# Patient Record
Sex: Male | Born: 1975 | Race: White | Hispanic: No | Marital: Married | Smoking: Never smoker
Health system: Southern US, Community
[De-identification: ages and names within clinical notes are randomized; demographics above are authoritative.]

## PROBLEM LIST (undated history)

## (undated) DIAGNOSIS — J45909 Unspecified asthma, uncomplicated: Secondary | ICD-10-CM

## (undated) HISTORY — PX: INNER EAR SURGERY: SHX679

## (undated) HISTORY — PX: VASECTOMY: SHX75

---

## 2012-12-29 ENCOUNTER — Encounter: Payer: Self-pay | Admitting: Podiatry

## 2012-12-29 ENCOUNTER — Ambulatory Visit (INDEPENDENT_AMBULATORY_CARE_PROVIDER_SITE_OTHER): Payer: BC Managed Care – PPO | Admitting: Podiatry

## 2012-12-29 ENCOUNTER — Ambulatory Visit (INDEPENDENT_AMBULATORY_CARE_PROVIDER_SITE_OTHER): Payer: BC Managed Care – PPO

## 2012-12-29 VITALS — BP 112/63 | HR 78 | Resp 16 | Ht 69.0 in | Wt 207.0 lb

## 2012-12-29 DIAGNOSIS — M79671 Pain in right foot: Secondary | ICD-10-CM

## 2012-12-29 DIAGNOSIS — M79609 Pain in unspecified limb: Secondary | ICD-10-CM

## 2012-12-29 DIAGNOSIS — M722 Plantar fascial fibromatosis: Secondary | ICD-10-CM

## 2012-12-29 MED ORDER — METHYLPREDNISOLONE (PAK) 4 MG PO TABS: ORAL_TABLET | ORAL | Status: DC

## 2012-12-29 MED ORDER — MELOXICAM 15 MG PO TABS: 15.0000 mg | ORAL_TABLET | Freq: Every day | ORAL | Status: DC

## 2012-12-29 NOTE — Patient Instructions (Signed)
Plantar Fasciitis (Heel Spur Syndrome) with Rehab The plantar fascia is a fibrous, ligament-like, soft-tissue structure that spans the bottom of the foot. Plantar fasciitis is a condition that causes pain in the foot due to inflammation of the tissue. SYMPTOMS   Pain and tenderness on the underneath side of the foot.  Pain that worsens with standing or walking. CAUSES  Plantar fasciitis is caused by irritation and injury to the plantar fascia on the underneath side of the foot. Common mechanisms of injury include:  Direct trauma to bottom of the foot.  Damage to a small nerve that runs under the foot where the main fascia attaches to the heel bone.  Stress placed on the plantar fascia due to bone spurs. RISK INCREASES WITH:   Activities that place stress on the plantar fascia (running, jumping, pivoting, or cutting).  Poor strength and flexibility.  Improperly fitted shoes.  Tight calf muscles.  Flat feet.  Failure to warm-up properly before activity.  Obesity. PREVENTION  Warm up and stretch properly before activity.  Allow for adequate recovery between workouts.  Maintain physical fitness:  Strength, flexibility, and endurance.  Cardiovascular fitness.  Maintain a health body weight.  Avoid stress on the plantar fascia.  Wear properly fitted shoes, including arch supports for individuals who have flat feet. PROGNOSIS  If treated properly, then the symptoms of plantar fasciitis usually resolve without surgery. However, occasionally surgery is necessary. RELATED COMPLICATIONS   Recurrent symptoms that may result in a chronic condition.  Problems of the lower back that are caused by compensating for the injury, such as limping.  Pain or weakness of the foot during push-off following surgery.  Chronic inflammation, scarring, and partial or complete fascia tear, occurring more often from repeated injections. TREATMENT  Treatment initially involves the use of  ice and medication to help reduce pain and inflammation. The use of strengthening and stretching exercises may help reduce pain with activity, especially stretches of the Achilles tendon. These exercises may be performed at home or with a therapist. Your caregiver may recommend that you use heel cups of arch supports to help reduce stress on the plantar fascia. Occasionally, corticosteroid injections are given to reduce inflammation. If symptoms persist for greater than 6 months despite non-surgical (conservative), then surgery may be recommended.  MEDICATION   If pain medication is necessary, then nonsteroidal anti-inflammatory medications, such as aspirin and ibuprofen, or other minor pain relievers, such as acetaminophen, are often recommended.  Do not take pain medication within 7 days before surgery.  Prescription pain relievers may be given if deemed necessary by your caregiver. Use only as directed and only as much as you need.  Corticosteroid injections may be given by your caregiver. These injections should be reserved for the most serious cases, because they may only be given a certain number of times. HEAT AND COLD  Cold treatment (icing) relieves pain and reduces inflammation. Cold treatment should be applied for 10 to 15 minutes every 2 to 3 hours for inflammation and pain and immediately after any activity that aggravates your symptoms. Use ice packs or massage the area with a piece of ice (ice massage).  Heat treatment may be used prior to performing the stretching and strengthening activities prescribed by your caregiver, physical therapist, or athletic trainer. Use a heat pack or soak the injury in warm water. SEEK IMMEDIATE MEDICAL CARE IF:  Treatment seems to offer no benefit, or the condition worsens.  Any medications produce adverse side effects. EXERCISES RANGE   OF MOTION (ROM) AND STRETCHING EXERCISES - Plantar Fasciitis (Heel Spur Syndrome) These exercises may help you  when beginning to rehabilitate your injury. Your symptoms may resolve with or without further involvement from your physician, physical therapist or athletic trainer. While completing these exercises, remember:   Restoring tissue flexibility helps normal motion to return to the joints. This allows healthier, less painful movement and activity.  An effective stretch should be held for at least 30 seconds.  A stretch should never be painful. You should only feel a gentle lengthening or release in the stretched tissue. RANGE OF MOTION - Toe Extension, Flexion  Sit with your right / left leg crossed over your opposite knee.  Grasp your toes and gently pull them back toward the top of your foot. You should feel a stretch on the bottom of your toes and/or foot.  Hold this stretch for __________ seconds.  Now, gently pull your toes toward the bottom of your foot. You should feel a stretch on the top of your toes and or foot.  Hold this stretch for __________ seconds. Repeat __________ times. Complete this stretch __________ times per day.  RANGE OF MOTION - Ankle Dorsiflexion, Active Assisted  Remove shoes and sit on a chair that is preferably not on a carpeted surface.  Place right / left foot under knee. Extend your opposite leg for support.  Keeping your heel down, slide your right / left foot back toward the chair until you feel a stretch at your ankle or calf. If you do not feel a stretch, slide your bottom forward to the edge of the chair, while still keeping your heel down.  Hold this stretch for __________ seconds. Repeat __________ times. Complete this stretch __________ times per day.  STRETCH  Gastroc, Standing  Place hands on wall.  Extend right / left leg, keeping the front knee somewhat bent.  Slightly point your toes inward on your back foot.  Keeping your right / left heel on the floor and your knee straight, shift your weight toward the wall, not allowing your back to  arch.  You should feel a gentle stretch in the right / left calf. Hold this position for __________ seconds. Repeat __________ times. Complete this stretch __________ times per day. STRETCH  Soleus, Standing  Place hands on wall.  Extend right / left leg, keeping the other knee somewhat bent.  Slightly point your toes inward on your back foot.  Keep your right / left heel on the floor, bend your back knee, and slightly shift your weight over the back leg so that you feel a gentle stretch deep in your back calf.  Hold this position for __________ seconds. Repeat __________ times. Complete this stretch __________ times per day. STRETCH  Gastrocsoleus, Standing  Note: This exercise can place a lot of stress on your foot and ankle. Please complete this exercise only if specifically instructed by your caregiver.   Place the ball of your right / left foot on a step, keeping your other foot firmly on the same step.  Hold on to the wall or a rail for balance.  Slowly lift your other foot, allowing your body weight to press your heel down over the edge of the step.  You should feel a stretch in your right / left calf.  Hold this position for __________ seconds.  Repeat this exercise with a slight bend in your right / left knee. Repeat __________ times. Complete this stretch __________ times per day.    STRENGTHENING EXERCISES - Plantar Fasciitis (Heel Spur Syndrome)  These exercises may help you when beginning to rehabilitate your injury. They may resolve your symptoms with or without further involvement from your physician, physical therapist or athletic trainer. While completing these exercises, remember:   Muscles can gain both the endurance and the strength needed for everyday activities through controlled exercises.  Complete these exercises as instructed by your physician, physical therapist or athletic trainer. Progress the resistance and repetitions only as guided. STRENGTH - Towel  Curls  Sit in a chair positioned on a non-carpeted surface.  Place your foot on a towel, keeping your heel on the floor.  Pull the towel toward your heel by only curling your toes. Keep your heel on the floor.  If instructed by your physician, physical therapist or athletic trainer, add ____________________ at the end of the towel. Repeat __________ times. Complete this exercise __________ times per day. STRENGTH - Ankle Inversion  Secure one end of a rubber exercise band/tubing to a fixed object (table, pole). Loop the other end around your foot just before your toes.  Place your fists between your knees. This will focus your strengthening at your ankle.  Slowly, pull your big toe up and in, making sure the band/tubing is positioned to resist the entire motion.  Hold this position for __________ seconds.  Have your muscles resist the band/tubing as it slowly pulls your foot back to the starting position. Repeat __________ times. Complete this exercises __________ times per day.  Document Released: 02/05/2005 Document Revised: 04/30/2011 Document Reviewed: 05/20/2008 [employer] Patient Information 2014 [employer]. Plantar Fasciitis Plantar fasciitis is a common condition that causes foot pain. It is soreness (inflammation) of the band of tough fibrous tissue on the bottom of the foot that runs from the heel bone (calcaneus) to the ball of the foot. The cause of this soreness may be from excessive standing, poor fitting shoes, running on hard surfaces, being overweight, having an abnormal walk, or overuse (this is common in runners) of the painful foot or feet. It is also common in aerobic exercise dancers and ballet dancers. SYMPTOMS  Most people with plantar fasciitis complain of:  Severe pain in the morning on the bottom of their foot especially when taking the first steps out of bed. This pain recedes after a few minutes of walking.  Severe pain is experienced also during walking  following a long period of inactivity.  Pain is worse when walking barefoot or up stairs DIAGNOSIS   Your caregiver will diagnose this condition by examining and feeling your foot.  Special tests such as X-rays of your foot, are usually not needed. PREVENTION   Consult a sports medicine professional before beginning a new exercise program.  Walking programs offer a good workout. With walking there is a lower chance of overuse injuries common to runners. There is less impact and less jarring of the joints.  Begin all new exercise programs slowly. If problems or pain develop, decrease the amount of time or distance until you are at a comfortable level.  Wear good shoes and replace them regularly.  Stretch your foot and the heel cords at the back of the ankle (Achilles tendon) both before and after exercise.  Run or exercise on even surfaces that are not hard. For example, asphalt is better than pavement.  Do not run barefoot on hard surfaces.  If using a treadmill, vary the incline.  Do not continue to workout if you have foot or joint   problems. Seek professional help if they do not improve. HOME CARE INSTRUCTIONS   Avoid activities that cause you pain until you recover.  Use ice or cold packs on the problem or painful areas after working out.  Only take over-the-counter or prescription medicines for pain, discomfort, or fever as directed by your caregiver.  Soft shoe inserts or athletic shoes with air or gel sole cushions may be helpful.  If problems continue or become more severe, consult a sports medicine caregiver or your own health care provider. Cortisone is a potent anti-inflammatory medication that may be injected into the painful area. You can discuss this treatment with your caregiver. MAKE SURE YOU:   Understand these instructions.  Will watch your condition.  Will get help right away if you are not doing well or get worse. Document Released: 10/31/2000 Document  Revised: 04/30/2011 Document Reviewed: 12/31/2007 [employer] Patient Information 2014 [employer].  

## 2012-12-29 NOTE — Progress Notes (Signed)
N HURT L B/L HEEL AND WHOLE BOTTOM OF FEET B/L D 3 M O SUDDEN C WORSE A STANDING, WALKING T OTC INSERTS

## 2012-12-29 NOTE — Progress Notes (Signed)
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 He states that his feet are severely painful bilateral. This been going on for about 3 months and come on all of a sudden seems to be getting worse particularly standing and walking he tried over-the-counter inserts no avail.  Objective: Vital signs are stable he is alert and oriented x3. I reviewed his past medical history medications and allergies. Review of systems unremarkable. Bilateral lower extremity exam: Vascular evaluation demonstrates strong palpable pulses bilateral. Neurologic sensorium is intact per since once the monofilament. Deep tendon reflexes are intact bilateral. Muscle strength is 5 over 5 dorsiflexors plantar flexors inverters and evertors. Cutaneous evaluation demonstrates supple while hydrated cutis. Orthopedic evaluation demonstrates pain on palpation to the medial calcaneal tubercles bilateral. Radiographic evaluation demonstrates soft tissue increase in density at the plantar fascial calcaneal insertion sites.  Assessment: Plantar fasciitis bilateral.  Plan: We discussed the etiology pathology conservative versus surgical therapies at this point in time we are going to start him on a Sterapred Dosepak to be followed by Mobic. He was injected to the bilateral heels today and plantar fascial strapping sore applied. He was dispensed a night splint. We discussed appropriate shoe gear stretching exercises and ice therapy and he was dispensed oral and written home-going instructions for the care of his feet and exercises. Followup with him in one month.

## 2013-01-28 ENCOUNTER — Encounter: Payer: Self-pay | Admitting: Podiatry

## 2013-01-28 ENCOUNTER — Ambulatory Visit (INDEPENDENT_AMBULATORY_CARE_PROVIDER_SITE_OTHER): Payer: BC Managed Care – PPO | Admitting: Podiatry

## 2013-01-28 VITALS — BP 157/83 | HR 90 | Resp 16 | Ht 70.0 in | Wt 205.0 lb

## 2013-01-28 DIAGNOSIS — M722 Plantar fascial fibromatosis: Secondary | ICD-10-CM

## 2013-01-28 MED ORDER — DICLOFENAC SODIUM 75 MG PO TBEC: 75.0000 mg | DELAYED_RELEASE_TABLET | Freq: Two times a day (BID) | ORAL | Status: DC

## 2013-01-28 NOTE — Progress Notes (Signed)
Vincent Melendez presents today for followup of his plantar fasciitis is at the right doing great the left is still hurting. He continues to take his meloxicam however causes flushing to his face.  Objective: Vital signs are stable he is alert and oriented x3. Pulses are palpable. He has pain on palpation medial continued tubercle of the left heel. Non-the right.  Assessment: Plantar fasciitis left foot.  Plan: Injected the left foot again today continue the use of the night splint changed his meloxicam to diclofenac 75 mg 1 by mouth twice a day continue ice therapy shoe gear modification stretching exercises and I will followup with him in one month. Orthotics may be necessary.

## 2013-03-04 ENCOUNTER — Encounter: Payer: Self-pay | Admitting: Podiatry

## 2013-03-04 ENCOUNTER — Ambulatory Visit (INDEPENDENT_AMBULATORY_CARE_PROVIDER_SITE_OTHER): Payer: BC Managed Care – PPO | Admitting: Podiatry

## 2013-03-04 VITALS — BP 111/71 | HR 74 | Resp 16 | Ht 69.0 in | Wt 198.0 lb

## 2013-03-04 DIAGNOSIS — M722 Plantar fascial fibromatosis: Secondary | ICD-10-CM

## 2013-03-04 NOTE — Progress Notes (Signed)
He presents today for followup of his plantar fasciitis to his left heel. He states that this point it's about 85% better. When I asked him about his medication he states that he takes that when he can remember to. He continues to wear his night splint on her regular basis. He denies icing.  Objective: Vital signs are stable he is alert and oriented x3. Pulses are palpable left lower extremity. Pain on palpation medial continued tubercle of the left heel is much better than previously noted.  Assessment: Plantar fasciitis residual in nature left foot.  Plan: Discussed etiology pathology conservative versus surgical therapies. At this point I reinjected him today with his third dose of Kenalog left foot. He will start icing. He will continue his medication on a regular basis. And continue the use of his night splint. I will followup with him in one month at which time if he is not close to 100% then we will go ahead and get him a pair of orthotics made.

## 2013-04-01 ENCOUNTER — Ambulatory Visit: Payer: BC Managed Care – PPO | Admitting: Podiatry

## 2013-08-31 ENCOUNTER — Ambulatory Visit: Payer: Self-pay | Admitting: Gastroenterology

## 2013-12-06 ENCOUNTER — Ambulatory Visit: Payer: Self-pay | Admitting: Physician Assistant

## 2013-12-16 ENCOUNTER — Ambulatory Visit: Payer: Self-pay

## 2014-05-09 ENCOUNTER — Ambulatory Visit: Payer: Self-pay | Admitting: Registered Nurse

## 2014-09-23 ENCOUNTER — Encounter: Payer: Self-pay | Admitting: Family Medicine

## 2014-09-23 ENCOUNTER — Ambulatory Visit (INDEPENDENT_AMBULATORY_CARE_PROVIDER_SITE_OTHER): Payer: BLUE CROSS/BLUE SHIELD | Admitting: Family Medicine

## 2014-09-23 ENCOUNTER — Ambulatory Visit: Payer: Self-pay | Admitting: Physician Assistant

## 2014-09-23 VITALS — BP 108/60 | HR 80 | Temp 97.8°F | Resp 16 | Wt 207.0 lb

## 2014-09-23 DIAGNOSIS — I499 Cardiac arrhythmia, unspecified: Secondary | ICD-10-CM | POA: Insufficient documentation

## 2014-09-23 DIAGNOSIS — L719 Rosacea, unspecified: Secondary | ICD-10-CM

## 2014-09-23 DIAGNOSIS — L309 Dermatitis, unspecified: Secondary | ICD-10-CM | POA: Insufficient documentation

## 2014-09-23 DIAGNOSIS — J4540 Moderate persistent asthma, uncomplicated: Secondary | ICD-10-CM | POA: Diagnosis not present

## 2014-09-23 DIAGNOSIS — F419 Anxiety disorder, unspecified: Secondary | ICD-10-CM | POA: Insufficient documentation

## 2014-09-23 DIAGNOSIS — J309 Allergic rhinitis, unspecified: Secondary | ICD-10-CM | POA: Insufficient documentation

## 2014-09-23 DIAGNOSIS — F430 Acute stress reaction: Secondary | ICD-10-CM | POA: Insufficient documentation

## 2014-09-23 DIAGNOSIS — J45909 Unspecified asthma, uncomplicated: Secondary | ICD-10-CM | POA: Insufficient documentation

## 2014-09-23 DIAGNOSIS — K219 Gastro-esophageal reflux disease without esophagitis: Secondary | ICD-10-CM | POA: Insufficient documentation

## 2014-09-23 MED ORDER — MOMETASONE FUROATE 0.1 % EX CREA: 1.0000 "application " | TOPICAL_CREAM | Freq: Every day | CUTANEOUS | Status: DC

## 2014-09-23 MED ORDER — BRIMONIDINE TARTRATE 0.33 % EX GEL: CUTANEOUS | Status: DC

## 2014-09-23 MED ORDER — MONTELUKAST SODIUM 10 MG PO TABS: 10.0000 mg | ORAL_TABLET | Freq: Every day | ORAL | Status: DC

## 2014-09-23 MED ORDER — DOXYCYCLINE HYCLATE 100 MG PO TABS: 100.0000 mg | ORAL_TABLET | Freq: Two times a day (BID) | ORAL | Status: DC

## 2014-09-23 NOTE — Progress Notes (Signed)
Patient ID: Vincent Melendez, male   DOB: 1975-06-12, 39 y.o.   MRN: [id number]    Subjective:  HPI Pt is here for a rash. It has been there for about 4 days. It is located on his chest on the left side and he has one bump on his right cheek. It appears to be raised above the skin, almost pimple like lesion. He reports they do itch a little and they are sometimes a little "achy". He reports that he googled it and google said it was eczema.  Prior to Admission medications   Medication Sig Start Date End Date Taking? Authorizing Provider  albuterol (VENTOLIN HFA) 108 (90 BASE) MCG/ACT inhaler Inhale into the lungs. 02/23/14  Yes Historical Provider, MD  Aspirin-Acetaminophen-Caffeine (EXCEDRIN MIGRAINE PO) Take by mouth as needed.   Yes Historical Provider, MD  diphenhydrAMINE (BENADRYL) 25 MG tablet Take by mouth.   Yes Historical Provider, MD  Fluticasone-Salmeterol (ADVAIR DISKUS) 250-50 MCG/DOSE AEPB Inhale into the lungs. 10/08/13  Yes Historical Provider, MD  loratadine (CLARITIN REDITABS) 10 MG dissolvable tablet Take by mouth.   Yes Historical Provider, MD  montelukast (SINGULAIR) 10 MG tablet Take by mouth. 12/16/13  Yes Historical Provider, MD  fluticasone (FLONASE ALLERGY RELIEF) 50 MCG/ACT nasal spray Place into the nose.    Historical Provider, MD    Patient Active Problem List   Diagnosis Date Noted  . Acute stress disorder 09/23/2014  . Allergic rhinitis 09/23/2014  . Anxiety 09/23/2014  . Airway hyperreactivity 09/23/2014  . Acid reflux 09/23/2014  . Irregular cardiac rhythm 09/23/2014  . RAD (reactive airway disease) 09/23/2014    History reviewed. No pertinent past medical history.  History   Social History  . Marital Status: Married    Spouse Name: N/A  . Number of Children: 2  . Years of Education: N/A   Occupational History  . Self-employed     Full Time; Sell [employer]   Social History Main Topics  . Smoking status: Never Smoker   . Smokeless  tobacco: Never Used  . Alcohol Use: Yes     Comment: occasionally  . Drug Use: No  . Sexual Activity: Not on file   Other Topics Concern  . Not on file   Social History Narrative    Allergies  Allergen Reactions  . Codeine Itching    Review of Systems  Constitutional: Negative.   HENT: Negative.   Eyes: Negative.   Respiratory: Negative.   Cardiovascular: Negative.   Gastrointestinal: Negative.   Genitourinary: Negative.   Musculoskeletal: Negative.   Skin: Positive for itching and rash.  Neurological: Negative.   Endo/Heme/Allergies: Negative.   Psychiatric/Behavioral: Negative.      Objective:  BP 108/60 mmHg  Pulse 80  Temp(Src) 97.8 F (36.6 C) (Oral)  Resp 16  Wt 207 lb (93.895 kg)  Physical Exam  Constitutional: He is well-developed, well-nourished, and in no distress.  Skin: Rash noted. There is erythema.  Does have red rash on his nose and face. Does have deeper papules palpable under right check. Some scaly lesions on right check. Also has rash on right chest. Erythematous papules.        Assessment and Plan :  1. Rosacea Condition is worsening. Will start medication for better control.  Patient instructed to call back if condition worsens or does not improve.    - mometasone (ELOCON) 0.1 % cream; Apply 1 application topically daily.  Dispense: 45 g; Refill: 0 - doxycycline (VIBRA-TABS) 100 MG  tablet; Take 1 tablet (100 mg total) by mouth 2 (two) times daily.  Dispense: 60 tablet; Refill: 0 - Brimonidine Tartrate (MIRVASO) 0.33 % GEL; Apply daily  Dispense: 30 g; Refill: 1  2. Eczema Worsening. Will start medication  - mometasone (ELOCON) 0.1 % cream; Apply 1 application topically daily.  Dispense: 45 g; Refill: 2  3. Airway hyperreactivity, moderate persistent, uncomplicated Stable. Refill medication today.   - montelukast (SINGULAIR) 10 MG tablet; Take 1 tablet (10 mg total) by mouth at bedtime.  Dispense: 30 tablet; Refill: 1   Lorie Phenix, MD  [facility] 09/23/2014 3:41 PM

## 2014-09-27 ENCOUNTER — Telehealth: Payer: Self-pay

## 2014-09-27 NOTE — Telephone Encounter (Signed)
Pt advised as directed below.  He reports the rash seems to be clearing up.   Thanks,   -Vernona Rieger

## 2014-09-27 NOTE — Telephone Encounter (Signed)
-----   Message from Lorie Phenix, MD sent at 09/26/2014  1:57 PM EDT ----- Please call patient and see if rash improving. Forgot to tell him to use steroid cream sparingly.  Please tell him to do this.  And we can refer to Derm if not improving. Thanks.

## 2014-10-12 ENCOUNTER — Other Ambulatory Visit: Payer: Self-pay | Admitting: Physician Assistant

## 2014-10-12 DIAGNOSIS — J45909 Unspecified asthma, uncomplicated: Secondary | ICD-10-CM

## 2014-10-12 MED ORDER — FLUTICASONE-SALMETEROL 250-50 MCG/DOSE IN AEPB: 1.0000 | INHALATION_SPRAY | Freq: Two times a day (BID) | RESPIRATORY_TRACT | Status: DC

## 2014-10-12 NOTE — Telephone Encounter (Signed)
Pt contacted office for refill request on the following medications:  Fluticasone-Salmeterol (ADVAIR DISKUS) 250-50 MCG/DOSE AEPB.  [facility]  [place].  EA#[phone]   Pt states he is completely out and is requesting this sent to the pharmacy today if possible/MW

## 2014-10-12 NOTE — Telephone Encounter (Signed)
Megan with [facility] called to request a refill on Fluticasone-Salmeterol (ADVAIR DISKUS) 250-50 MCG/DOSE AEPB for pt because the fax she was trying to send was rejected. This medication was last written by Sharen Hint. Thanks TNP

## 2014-10-13 ENCOUNTER — Other Ambulatory Visit: Payer: Self-pay

## 2014-10-13 DIAGNOSIS — J45909 Unspecified asthma, uncomplicated: Secondary | ICD-10-CM

## 2014-10-13 DIAGNOSIS — J4540 Moderate persistent asthma, uncomplicated: Secondary | ICD-10-CM

## 2014-10-13 MED ORDER — FLUTICASONE-SALMETEROL 250-50 MCG/DOSE IN AEPB: 1.0000 | INHALATION_SPRAY | Freq: Two times a day (BID) | RESPIRATORY_TRACT | Status: DC

## 2015-01-19 ENCOUNTER — Other Ambulatory Visit: Payer: Self-pay | Admitting: Family Medicine

## 2015-01-19 DIAGNOSIS — J309 Allergic rhinitis, unspecified: Secondary | ICD-10-CM

## 2015-01-24 ENCOUNTER — Telehealth: Payer: Self-pay | Admitting: Physician Assistant

## 2015-01-24 NOTE — Telephone Encounter (Signed)
Pt called saying when he went to pick up the Fluticasone-Salmeterol (ADVAIR DISKUS) 250-50 MCG/DOSE AEPB The price was 290.00.  He want to know if there is something else he can use.Marland Kitchen.   He uses [facility] in [place]  Please call back at [phone]  Thanks Barth Kirkseri

## 2015-01-24 NOTE — Telephone Encounter (Signed)
All the steroid inhalers are very expensive.

## 2015-01-24 NOTE — Telephone Encounter (Signed)
All of the steroid inhalers are very expensive.  A little confused as this rx was written months ago.  Why is just he filling it now?  Is he having acute symptoms. This is a maintenance medication. Thanks.

## 2015-01-25 NOTE — Telephone Encounter (Signed)
Pt reports he takes Advair everyday; his insurance max out until the end of the year.  He would like samples to get him through the end of the year.   Thanks,   -Vernona RiegerLaura

## 2015-01-25 NOTE — Telephone Encounter (Signed)
Ok to leave samples. Thanks.   

## 2015-01-25 NOTE — Telephone Encounter (Signed)
LMTCB 01/25/2015  Thanks,   -Doreen Garretson  

## 2015-03-05 ENCOUNTER — Ambulatory Visit
Admission: EM | Admit: 2015-03-05 | Discharge: 2015-03-05 | Disposition: A | Discharge status: Home / Self Care | Payer: BLUE CROSS/BLUE SHIELD | Attending: Family Medicine | Admitting: Family Medicine

## 2015-03-05 DIAGNOSIS — H6123 Impacted cerumen, bilateral: Secondary | ICD-10-CM

## 2015-03-05 DIAGNOSIS — J069 Acute upper respiratory infection, unspecified: Secondary | ICD-10-CM

## 2015-03-05 DIAGNOSIS — H6691 Otitis media, unspecified, right ear: Secondary | ICD-10-CM

## 2015-03-05 MED ORDER — AMOXICILLIN-POT CLAVULANATE 875-125 MG PO TABS
1.0000 | ORAL_TABLET | Freq: Two times a day (BID) | ORAL | Status: DC
Start: 2015-03-05 — End: 2015-04-04

## 2015-03-05 NOTE — ED Provider Notes (Signed)
[facility]  ____________________________________________  Time seen: Approximately 9:40 AM  I have reviewed the triage vital signs and the nursing notes.   HISTORY  Chief Complaint Facial Pain and Otalgia   HPI Vincent Melendez is a 40 y.o. male presents with a complaint of one week of runny nose, nasal congestion with bilateral ear discomfort. Patient reports that nasal congestion has improved however bilateral ears feel clogged and stopped up. States that the right feels worse in the left. Reports current right ear discomfort is 4 out of 10 aching pain. No pain radiation. Patient reports that he is taking over-the-counter DayQuil which has helped with symptoms. Denies acute hearing deficits. Denies ear discharge or drainage. Denies head injury or trauma. Reports continues to eat and drink well. Denies fevers. Denies neck or back pain.   History reviewed. No pertinent past medical history.  Patient Active Problem List   Diagnosis Date Noted  . Acute stress disorder 09/23/2014  . Allergic rhinitis 09/23/2014  . Anxiety 09/23/2014  . Airway hyperreactivity 09/23/2014  . Acid reflux 09/23/2014  . Irregular cardiac rhythm 09/23/2014  . RAD (reactive airway disease) 09/23/2014  . Rosacea 09/23/2014  . Eczema 09/23/2014    Past Surgical History  Procedure Laterality Date  . Inner ear surgery Right   . Vasectomy      Current Outpatient Rx  Name  Route  Sig  Dispense  Refill             . Fluticasone-Salmeterol (ADVAIR DISKUS) 250-50 MCG/DOSE AEPB   Inhalation   Inhale 1 puff into the lungs every 12 (twelve) hours.   1 each   5   . loratadine (CLARITIN REDITABS) 10 MG dissolvable tablet   Oral   Take by mouth.         . montelukast (SINGULAIR) 10 MG tablet      TAKE 1 TABLET(10 MG) BY MOUTH AT BEDTIME   30 tablet   5   . albuterol (VENTOLIN HFA) 108 (90 BASE) MCG/ACT inhaler   Inhalation   Inhale into the lungs.         .           .            .           . fluticasone (FLONASE ALLERGY RELIEF) 50 MCG/ACT nasal spray   Nasal   Place into the nose.         . mometasone (ELOCON) 0.1 % cream   Topical   Apply 1 application topically daily.   45 g   0   . mometasone (ELOCON) 0.1 % cream   Topical   Apply 1 application topically daily.   45 g   2     Allergies Codeine  Family History  Problem Relation Age of Onset  . Fibromyalgia Mother   . COPD Father   . Kidney disease Father   . Healthy Sister   . Healthy Sister   . Arthritis Sister   . Healthy Sister   . Bipolar disorder Sister   . Healthy Sister   . Asthma Brother   . Anxiety disorder Brother   . Healthy Brother   . Healthy Brother   . Healthy Brother     Social History Social History  Substance Use Topics  . Smoking status: Never Smoker   . Smokeless tobacco: Never Used  . Alcohol Use: Yes     Comment: occasionally    Review of  Systems Constitutional: No fever/chills Eyes: No visual changes. ENT: No sore throat. Positive runny nose, nasal congestion and bilateral ear discomfort. Cardiovascular: Denies chest pain. Respiratory: Denies shortness of breath. Gastrointestinal: No abdominal pain.  No nausea, no vomiting.  No diarrhea.  No constipation. Genitourinary: Negative for dysuria. Musculoskeletal: Negative for back pain. Skin: Negative for rash. Neurological: Negative for headaches, focal weakness or numbness.  10-point ROS otherwise negative.  ____________________________________________   PHYSICAL EXAM:  VITAL SIGNS: ED Triage Vitals  Enc Vitals Group     BP 03/05/15 0845 105/58 mmHg     Pulse Rate 03/05/15 0845 80     Resp 03/05/15 0845 16     Temp 03/05/15 0845 96.2 F (35.7 C)     Temp Source 03/05/15 0845 Tympanic     SpO2 03/05/15 0845 99 %     Weight 03/05/15 0845 200 lb (90.719 kg)     Height 03/05/15 0845 5\' 9"  (1.753 m)     Head Cir --      Peak Flow --      Pain Score 03/05/15 0850 6     Pain Loc --       Pain Edu? --      Excl. in GC? --     Constitutional: Alert and oriented. Well appearing and in no acute distress. Eyes: Conjunctivae are normal. PERRL. EOMI. Head: Atraumatic. No sinus tenderness to palpation. No swelling. No erythema.  Ears: No surrounding erythema or swelling or tenderness bilaterally. No tenderness with auricle stimulation. Bilateral canals with complete cerumen impaction. No exudate or drainage.  Post irrigations ears reexamined. Bilateral canals clear. Left ear no erythema, normal TM. Right ear: Moderate erythema and dullness. Bilateral TMs appear intact.  Nose: Nasal congestion bilateral nasal turbinate erythema. Nares patent.  Mouth/Throat: Mucous membranes are moist.  Oropharynx non-erythematous. No tonsillar swelling or exudate. Neck: No stridor.  No cervical spine tenderness to palpation. Hematological/Lymphatic/Immunilogical: No cervical lymphadenopathy. Cardiovascular: Normal rate, regular rhythm. Grossly normal heart sounds.  Good peripheral circulation. Respiratory: Normal respiratory effort.  No retractions. Lungs CTAB. No wheezes, rales or rhonchi. Good air movement. Gastrointestinal: Soft and nontender.  Musculoskeletal: No lower or upper extremity tenderness nor edema.  No cervical, thoracic or lumbar tenderness. Neurologic:  Normal speech and language. No gross focal neurologic deficits are appreciated. No gait instability. Skin:  Skin is warm, dry and intact. No rash noted. Psychiatric: Mood and affect are normal. Speech and behavior are normal.  ____________________________________________   LABS (all labs ordered are listed, but only abnormal results are displayed)  Labs Reviewed - No data to display ____________________________________________   PROCEDURES  Procedure(s) performed:  Bilateral ears irrigated by RN.  INITIAL IMPRESSION / ASSESSMENT AND PLAN / ED COURSE  Pertinent labs & imaging results that were available during my care of  the patient were reviewed by me and considered in my medical decision making (see chart for details).  Very well-appearing patient. No acute distress. Presents with complaints of 1 week of runny nose, nasal congestion and sinus drainage. Reports that congestion and sinus congestion has improved however reports he is here today because of both ears feeling clogged and stopped up. Denies fevers. Denies headache or neck pain. Ears and surrounding areas nontender. Very well-appearing patient. Will irrigate bilateral ears and reevaluate.  Post irrigation patient reports bilateral ears feeling better. Ears reexamined. Patient with a right otitis media. As patient also with some recent sinus inflammation and nasal congestion and right otitis media will treat with  oral Augmentin. When necessary over-the-counter decongestants. Encouraged patient to follow-up closely with primary care physician or ENT. Patient reports he has a history of follow with Dr. Willeen Cass ENT.  Discussed follow up with Primary care physician this week. Discussed follow up and return parameters including no resolution or any worsening concerns. Patient verbalized understanding and agreed to plan.   ____________________________________________   FINAL CLINICAL IMPRESSION(S) / ED DIAGNOSES  Final diagnoses:  Acute right otitis media, recurrence not specified, unspecified otitis media type  Cerumen impaction, bilateral  Upper respiratory infection       Renford Dills, NP 03/05/15 1191  Renford Dills, NP 03/05/15 1042

## 2015-03-05 NOTE — Discharge Instructions (Signed)
Take medication as prescribed. Rest. Drink plenty of fluids.   Follow up with your primary care physician or ENT this week as needed. Return to Urgent care as needed for new or worsening  Concerns.   Otitis Media, Adult Otitis media is redness, soreness, and inflammation of the middle ear. Otitis media may be caused by allergies or, most commonly, by infection. Often it occurs as a complication of the common cold. SIGNS AND SYMPTOMS Symptoms of otitis media may include:  Earache.  Fever.  Ringing in your ear.  Headache.  Leakage of fluid from the ear. DIAGNOSIS To diagnose otitis media, your health care provider will examine your ear with an otoscope. This is an instrument that allows your health care provider to see into your ear in order to examine your eardrum. Your health care provider also will ask you questions about your symptoms. TREATMENT  Typically, otitis media resolves on its own within 3-5 days. Your health care provider may prescribe medicine to ease your symptoms of pain. If otitis media does not resolve within 5 days or is recurrent, your health care provider may prescribe antibiotic medicines if he or she suspects that a bacterial infection is the cause. HOME CARE INSTRUCTIONS   If you were prescribed an antibiotic medicine, finish it all even if you start to feel better.  Take medicines only as directed by your health care provider.  Keep all follow-up visits as directed by your health care provider. SEEK MEDICAL CARE IF:  You have otitis media only in one ear, or bleeding from your nose, or both.  You notice a lump on your neck.  You are not getting better in 3-5 days.  You feel worse instead of better. SEEK IMMEDIATE MEDICAL CARE IF:   You have pain that is not controlled with medicine.  You have swelling, redness, or pain around your ear or stiffness in your neck.  You notice that part of your face is paralyzed.  You notice that the bone behind your  ear (mastoid) is tender when you touch it. MAKE SURE YOU:   Understand these instructions.  Will watch your condition.  Will get help right away if you are not doing well or get worse.   This information is not intended to replace advice given to you by your health care provider. Make sure you discuss any questions you have with your health care provider.   Document Released: 11/11/2003 Document Revised: 02/26/2014 Document Reviewed: 09/02/2012 Elsevier Interactive Patient Education 2016 Else[employer] Inc[employer]  Cerumen Impaction The structures of the external ear canal secrete a waxy substance known as cerumen. Excess cerumen can build up in the ear canal, causing a condition known as cerumen impaction. Cerumen impaction can cause ear pain and disrupt the function of the ear. The rate of cerumen production differs for each individual. In certain individuals, the configuration of the ear canal may decrease his or her ability to naturally remove cerumen. CAUSES Cerumen impaction is caused by excessive cerumen production or buildup. RISK FACTORS  Frequent use of swabs to clean ears.  Having narrow ear canals.  Having eczema.  Being dehydrated. SIGNS AND SYMPTOMS  Diminished hearing.  Ear drainage.  Ear pain.  Ear itch. TREATMENT Treatment may involve:  Over-the-counter or prescription ear drops to soften the cerumen.  Removal of cerumen by a health care provider. This may be done with:  Irrigation with warm water. This is the most common method of removal.  Ear curettes and other instruments.  Surgery. This may be done in severe cases. HOME CARE INSTRUCTIONS  Take medicines only as directed by your health care provider.  Do not insert objects into the ear with the intent of cleaning the ear. PREVENTION  Do not insert objects into the ear, even with the intent of cleaning the ear. Removing cerumen as a part of normal hygiene is not necessary, and the use of swabs in the  ear canal is not recommended.  Drink enough water to keep your urine clear or pale yellow.  Control your eczema if you have it. SEEK MEDICAL CARE IF:  You develop ear pain.  You develop bleeding from the ear.  The cerumen does not clear after you use ear drops as directed.   This information is not intended to replace advice given to you by your health care provider. Make sure you discuss any questions you have with your health care provider.   Document Released: 03/15/2004 Document Revised: 02/26/2014 Document Reviewed: 09/22/2014 Elsevier Interactive Patient Education [employer] Inc[employer]

## 2015-03-05 NOTE — ED Notes (Signed)
Started last week with sinusitis. Over the past few days describes bilatateral ears as having pressure/stopped up, right worse than left

## 2015-04-04 ENCOUNTER — Ambulatory Visit (INDEPENDENT_AMBULATORY_CARE_PROVIDER_SITE_OTHER): Payer: BLUE CROSS/BLUE SHIELD | Admitting: Physician Assistant

## 2015-04-04 ENCOUNTER — Encounter: Payer: Self-pay | Admitting: Physician Assistant

## 2015-04-04 VITALS — BP 118/80 | HR 83 | Temp 98.2°F | Resp 16 | Wt 210.4 lb

## 2015-04-04 DIAGNOSIS — J0140 Acute pansinusitis, unspecified: Secondary | ICD-10-CM

## 2015-04-04 DIAGNOSIS — J4541 Moderate persistent asthma with (acute) exacerbation: Secondary | ICD-10-CM | POA: Diagnosis not present

## 2015-04-04 MED ORDER — DOXYCYCLINE HYCLATE 100 MG PO TABS: 100.0000 mg | ORAL_TABLET | Freq: Two times a day (BID) | ORAL | Status: DC

## 2015-04-04 NOTE — Progress Notes (Signed)
Patient: Vincent Melendez Male    DOB: 07-05-1975   40 y.o.   MRN: [id number] Visit Date: 04/04/2015  Today's Provider: Margaretann Loveless, PA-C   Chief Complaint  Patient presents with  . Cough   Subjective:    Cough This is a new problem. The current episode started in the past 7 days (On Friday). The problem has been gradually worsening. The cough is non-productive. Associated symptoms include a fever (on the weekend), nasal congestion, postnasal drip, rhinorrhea and shortness of breath. Pertinent negatives include no chest pain, chills, ear congestion, ear pain, headaches, sore throat or wheezing. The symptoms are aggravated by cold air. He has tried OTC cough suppressant for the symptoms. The treatment provided no relief.      Allergies  Allergen Reactions  . Codeine Itching   Previous Medications   ALBUTEROL (VENTOLIN HFA) 108 (90 BASE) MCG/ACT INHALER    Inhale into the lungs.   AMOXICILLIN-CLAVULANATE (AUGMENTIN) 875-125 MG TABLET    Take 1 tablet by mouth every 12 (twelve) hours.   ASPIRIN-ACETAMINOPHEN-CAFFEINE (EXCEDRIN MIGRAINE PO)    Take by mouth as needed. Reported on 04/04/2015   BRIMONIDINE TARTRATE (MIRVASO) 0.33 % GEL    Apply daily   DIPHENHYDRAMINE (BENADRYL) 25 MG TABLET    Take by mouth. Reported on 04/04/2015   FLUTICASONE (FLONASE ALLERGY RELIEF) 50 MCG/ACT NASAL SPRAY    Place into the nose. Reported on 04/04/2015   FLUTICASONE-SALMETEROL (ADVAIR DISKUS) 250-50 MCG/DOSE AEPB    Inhale 1 puff into the lungs every 12 (twelve) hours.   LORATADINE (CLARITIN REDITABS) 10 MG DISSOLVABLE TABLET    Take by mouth.   MOMETASONE (ELOCON) 0.1 % CREAM    Apply 1 application topically daily.   MONTELUKAST (SINGULAIR) 10 MG TABLET    TAKE 1 TABLET(10 MG) BY MOUTH AT BEDTIME    Review of Systems  Constitutional: Positive for fever (on the weekend). Negative for chills and fatigue.  HENT: Positive for congestion, postnasal drip, rhinorrhea, sneezing and voice  change. Negative for ear discharge, ear pain and sore throat.   Respiratory: Positive for cough, chest tightness and shortness of breath. Negative for wheezing.   Cardiovascular: Negative for chest pain, palpitations and leg swelling.  Gastrointestinal: Negative for nausea, vomiting and abdominal pain.  Neurological: Negative for dizziness and headaches.    Social History  Substance Use Topics  . Smoking status: Never Smoker   . Smokeless tobacco: Never Used  . Alcohol Use: Yes     Comment: occasionally   Objective:   BP 118/80 mmHg  Pulse 83  Temp(Src) 98.2 F (36.8 C) (Oral)  Resp 16  Wt 210 lb 6.4 oz (95.437 kg)  SpO2 97%  Physical Exam  Constitutional: He appears well-developed and well-nourished. No distress.  HENT:  Head: Normocephalic and atraumatic.  Right Ear: Hearing, external ear and ear canal normal. Tympanic membrane is not erythematous and not bulging. A middle ear effusion is present.  Left Ear: Hearing, external ear and ear canal normal. Tympanic membrane is not erythematous and not bulging. A middle ear effusion is present.  Nose: Mucosal edema and rhinorrhea present. Right sinus exhibits maxillary sinus tenderness and frontal sinus tenderness. Left sinus exhibits maxillary sinus tenderness and frontal sinus tenderness.  Mouth/Throat: Uvula is midline, oropharynx is clear and moist and mucous membranes are normal. No oropharyngeal exudate, posterior oropharyngeal edema or posterior oropharyngeal erythema.  Eyes: Conjunctivae and EOM are normal. Pupils are equal, round, and reactive to  light. Right eye exhibits no discharge. Left eye exhibits no discharge.  Neck: Normal range of motion. Neck supple. No tracheal deviation present. No Brudzinski's sign and no Kernig's sign noted. No thyromegaly present.  Cardiovascular: Normal rate, regular rhythm and normal heart sounds.  Exam reveals no gallop and no friction rub.   No murmur heard. Pulmonary/Chest: Effort normal  and breath sounds normal. No stridor. No respiratory distress. He has no wheezes. He has no rales.  Lymphadenopathy:    He has no cervical adenopathy.  Skin: Skin is warm and dry. He is not diaphoretic.  Vitals reviewed.       Assessment & Plan:     1. Asthmatic bronchitis, moderate persistent, with acute exacerbation He has been keeping his chest tightness and shortness of breath under control with increasing use of his albuterol inhaler. He does continue to use his Advair twice daily. He states that he has increased his albuterol and has used it approximately 2 or 3 times per day since becoming ill. He is to call the office if he starts developing worsening cough, shortness of breath or wheezing even with the increase of his albuterol as he may require an oral prednisone to decrease inflammation. - doxycycline (VIBRA-TABS) 100 MG tablet; Take 1 tablet (100 mg total) by mouth 2 (two) times daily.  Dispense: 20 tablet; Refill: 0  2. Acute pansinusitis, recurrence not specified Worsening symptoms that have not responded to over-the-counter medication. He needs to make sure to stay well-hydrated and get plenty of rest. I will give doxycycline as below. He may continue Mucinex DM as needed for cough and congestion. He may take Tylenol or ibuprofen as needed for fever and pain. He is to call the office if symptoms fail to improve or worsen. - doxycycline (VIBRA-TABS) 100 MG tablet; Take 1 tablet (100 mg total) by mouth 2 (two) times daily.  Dispense: 20 tablet; Refill: 0       Margaretann Loveless, PA-C  [facility]

## 2015-04-04 NOTE — Patient Instructions (Signed)
Chronic Bronchitis Chronic bronchitis is a lasting inflammation of the bronchial tubes, which are the tubes that carry air into your lungs. This is inflammation that occurs:   On most days of the week.   For at least three months at a time.   Over a period of two years in a row. When the bronchial tubes are inflamed, they start to produce mucus. The inflammation and buildup of mucus make it more difficult to breathe. Chronic bronchitis is usually a permanent problem and is one type of chronic obstructive pulmonary disease (COPD). People with chronic bronchitis are at greater risk for getting repeated colds, or respiratory infections. CAUSES  Chronic bronchitis most often occurs in people who have:  Long-standing, severe asthma.  A history of smoking.  Asthma and who also smoke. SIGNS AND SYMPTOMS  Chronic bronchitis may cause the following:   A cough that brings up mucus (productive cough).  Shortness of breath.  Early morning headache.  Wheezing.  Chest discomfort.   Recurring respiratory infections. DIAGNOSIS  Your health care provider may confirm the diagnosis by:  Taking your medical history.  Performing a physical exam.  Taking a chest X-ray.   Performing pulmonary function tests. TREATMENT  Treatment involves controlling symptoms with medicines, oxygen therapy, or making lifestyle changes, such as exercising and eating a healthy, well-balanced diet. Medicines could include:  Inhalers to improve air flow in and out of your lungs.  Antibiotics to treat bacterial infections, such as pneumonia, sinus infections, and acute bronchitis. As a preventative measure, your health care provider may recommend routine vaccinations for influenza and pneumonia. This is to prevent infection and hospitalization since you may be more at risk for these types of infections.  HOME CARE INSTRUCTIONS  Take medicines only as directed by your health care provider.   If you smoke  cigarettes, chew tobacco, or use electronic cigarettes, quit. If you need help quitting, ask your health care provider.  Avoid pollen, dust, animal dander, molds, smoke, and other things that cause shortness of breath or wheezing attacks.  Talk to your health care provider about possible exercise routines. Regular exercise is very important to help you feel better.  If you are prescribed oxygen use at home follow these guidelines:  Never smoke while using oxygen. Oxygen does not burn or explode, but flammable materials will burn faster in the presence of oxygen.  Keep a fire extinguisher close by. Let your fire department know that you have oxygen in your home.  Warn visitors not to smoke near you when you are using oxygen. Put up "no smoking" signs in your home where you most often use the oxygen.  Regularly test your smoke detectors at home to make sure they work. If you receive care in your home from a nurse or other health care provider, he or she may also check to make sure your smoke detectors work.  Ask your health care provider whether you would benefit from a pulmonary rehabilitation program.  Do not wait to get medical care if you have any concerning symptoms. Delays could cause permanent injury and may be life threatening. SEEK MEDICAL CARE IF:  You have increased coughing or shortness of breath or both.  You have muscle aches.  You have chest pain.  Your mucus gets thicker.  Your mucus changes from clear or white to yellow, green, gray, or bloody. SEEK IMMEDIATE MEDICAL CARE IF:  Your usual medicines do not stop your wheezing.   You have increased difficulty breathing.     You have any problems with the medicine you are taking, such as a rash, itching, swelling, or trouble breathing. MAKE SURE YOU:   Understand these instructions.  Will watch your condition.  Will get help right away if you are not doing well or get worse.   This information is not intended to  replace advice given to you by your health care provider. Make sure you discuss any questions you have with your health care provider.   Document Released: 11/23/2005 Document Revised: 02/26/2014 Document Reviewed: 03/16/2013 Elsevier Interactive Patient Education 2016 [employer] Sinusitis, Adult Sinusitis is redness, soreness, and inflammation of the paranasal sinuses. Paranasal sinuses are air pockets within the bones of your face. They are located beneath your eyes, in the middle of your forehead, and above your eyes. In healthy paranasal sinuses, mucus is able to drain out, and air is able to circulate through them by way of your nose. However, when your paranasal sinuses are inflamed, mucus and air can become trapped. This can allow bacteria and other germs to grow and cause infection. Sinusitis can develop quickly and last only a short time (acute) or continue over a long period (chronic). Sinusitis that lasts for more than 12 weeks is considered chronic. CAUSES Causes of sinusitis include:  Allergies.  Structural abnormalities, such as displacement of the cartilage that separates your nostrils (deviated septum), which can decrease the air flow through your nose and sinuses and affect sinus drainage.  Functional abnormalities, such as when the small hairs (cilia) that line your sinuses and help remove mucus do not work properly or are not present. SIGNS AND SYMPTOMS Symptoms of acute and chronic sinusitis are the same. The primary symptoms are pain and pressure around the affected sinuses. Other symptoms include:  Upper toothache.  Earache.  Headache.  Bad breath.  Decreased sense of smell and taste.  A cough, which worsens when you are lying flat.  Fatigue.  Fever.  Thick drainage from your nose, which often is green and may contain pus (purulent).  Swelling and warmth over the affected sinuses. DIAGNOSIS Your health care provider will perform a physical exam. During  your exam, your health care provider may perform any of the following to help determine if you have acute sinusitis or chronic sinusitis:  Look in your nose for signs of abnormal growths in your nostrils (nasal polyps).  Tap over the affected sinus to check for signs of infection.  View the inside of your sinuses using an imaging device that has a light attached (endoscope). If your health care provider suspects that you have chronic sinusitis, one or more of the following tests may be recommended:  Allergy tests.  Nasal culture. A sample of mucus is taken from your nose, sent to a lab, and screened for bacteria.  Nasal cytology. A sample of mucus is taken from your nose and examined by your health care provider to determine if your sinusitis is related to an allergy. TREATMENT Most cases of acute sinusitis are related to a viral infection and will resolve on their own within 10 days. Sometimes, medicines are prescribed to help relieve symptoms of both acute and chronic sinusitis. These may include pain medicines, decongestants, nasal steroid sprays, or saline sprays. However, for sinusitis related to a bacterial infection, your health care provider will prescribe antibiotic medicines. These are medicines that will help kill the bacteria causing the infection. Rarely, sinusitis is caused by a fungal infection. In these cases, your health care provider will prescribe  antifungal medicine. For some cases of chronic sinusitis, surgery is needed. Generally, these are cases in which sinusitis recurs more than 3 times per year, despite other treatments. HOME CARE INSTRUCTIONS  Drink plenty of water. Water helps thin the mucus so your sinuses can drain more easily.  Use a humidifier.  Inhale steam 3-4 times a day (for example, sit in the bathroom with the shower running).  Apply a warm, moist washcloth to your face 3-4 times a day, or as directed by your health care provider.  Use saline nasal  sprays to help moisten and clean your sinuses.  Take medicines only as directed by your health care provider.  If you were prescribed either an antibiotic or antifungal medicine, finish it all even if you start to feel better. SEEK IMMEDIATE MEDICAL CARE IF:  You have increasing pain or severe headaches.  You have nausea, vomiting, or drowsiness.  You have swelling around your face.  You have vision problems.  You have a stiff neck.  You have difficulty breathing.   This information is not intended to replace advice given to you by your health care provider. Make sure you discuss any questions you have with your health care provider.   Document Released: 02/05/2005 Document Revised: 02/26/2014 Document Reviewed: 02/20/2011 Elsevier Interactive Patient Education [employer]

## 2015-04-06 ENCOUNTER — Encounter: Payer: Self-pay | Admitting: Physician Assistant

## 2015-04-06 ENCOUNTER — Ambulatory Visit (INDEPENDENT_AMBULATORY_CARE_PROVIDER_SITE_OTHER): Payer: BLUE CROSS/BLUE SHIELD | Admitting: Physician Assistant

## 2015-04-06 VITALS — BP 120/60 | HR 96 | Temp 97.9°F | Resp 16 | Wt 210.4 lb

## 2015-04-06 DIAGNOSIS — R062 Wheezing: Secondary | ICD-10-CM | POA: Diagnosis not present

## 2015-04-06 DIAGNOSIS — R6889 Other general symptoms and signs: Secondary | ICD-10-CM

## 2015-04-06 DIAGNOSIS — R059 Cough, unspecified: Secondary | ICD-10-CM

## 2015-04-06 DIAGNOSIS — R05 Cough: Secondary | ICD-10-CM

## 2015-04-06 MED ORDER — AMOXICILLIN-POT CLAVULANATE 875-125 MG PO TABS: 1.0000 | ORAL_TABLET | Freq: Two times a day (BID) | ORAL | Status: DC

## 2015-04-06 MED ORDER — BENZONATATE 200 MG PO CAPS: 200.0000 mg | ORAL_CAPSULE | Freq: Three times a day (TID) | ORAL | Status: DC | PRN

## 2015-04-06 MED ORDER — PREDNISONE 10 MG PO TABS: ORAL_TABLET | ORAL | Status: DC

## 2015-04-06 NOTE — Progress Notes (Signed)
Patient: Vincent Melendez Male    DOB: Jul 06, 1975   40 y.o.   MRN: [id number] Visit Date: 04/06/2015  Today's Provider: Margaretann Loveless, PA-C   Chief Complaint  Patient presents with  . Cough   Subjective:    Cough This is a recurrent (PAtient was here on 04/04/15 for cough) problem. The current episode started 1 to 4 weeks ago. The problem has been gradually worsening. The problem occurs constantly. Associated symptoms include chills (Since Monday and body ache), a fever (at 2 am this morning and he took Tylenol), headaches, nasal congestion, postnasal drip, rhinorrhea and shortness of breath. Pertinent negatives include no chest pain, ear congestion, ear pain, sore throat or wheezing. He has tried prescription cough suppressant, steroid inhaler and ipratropium inhaler (Doxycycline and Tylenol this morning and Ibuprofen on Monday.) for the symptoms. The treatment provided no relief. His past medical history is significant for asthma and environmental allergies. There is no history of bronchiectasis, bronchitis, COPD, emphysema or pneumonia.       Allergies  Allergen Reactions  . Codeine Itching   Previous Medications   ALBUTEROL (VENTOLIN HFA) 108 (90 BASE) MCG/ACT INHALER    Inhale into the lungs.   ASPIRIN-ACETAMINOPHEN-CAFFEINE (EXCEDRIN MIGRAINE PO)    Take by mouth as needed. Reported on 04/04/2015   BRIMONIDINE TARTRATE (MIRVASO) 0.33 % GEL    Apply daily   DIPHENHYDRAMINE (BENADRYL) 25 MG TABLET    Take by mouth. Reported on 04/04/2015   DOXYCYCLINE (VIBRA-TABS) 100 MG TABLET    Take 1 tablet (100 mg total) by mouth 2 (two) times daily.   FLUTICASONE (FLONASE ALLERGY RELIEF) 50 MCG/ACT NASAL SPRAY    Place into the nose. Reported on 04/04/2015   FLUTICASONE-SALMETEROL (ADVAIR DISKUS) 250-50 MCG/DOSE AEPB    Inhale 1 puff into the lungs every 12 (twelve) hours.   LORATADINE (CLARITIN REDITABS) 10 MG DISSOLVABLE TABLET    Take by mouth.   MOMETASONE (ELOCON) 0.1 % CREAM     Apply 1 application topically daily.   MONTELUKAST (SINGULAIR) 10 MG TABLET    TAKE 1 TABLET(10 MG) BY MOUTH AT BEDTIME    Review of Systems  Constitutional: Positive for fever (at 2 am this morning and he took Tylenol) and chills (Since Monday and body ache).  HENT: Positive for congestion, postnasal drip, rhinorrhea, sinus pressure and sneezing. Negative for ear pain and sore throat.   Respiratory: Positive for cough, chest tightness and shortness of breath. Negative for wheezing.   Cardiovascular: Negative for chest pain.  Gastrointestinal: Negative for nausea, vomiting and abdominal pain.  Allergic/Immunologic: Positive for environmental allergies.  Neurological: Positive for headaches. Negative for dizziness.    Social History  Substance Use Topics  . Smoking status: Never Smoker   . Smokeless tobacco: Never Used  . Alcohol Use: Yes     Comment: occasionally   Objective:   BP 120/60 mmHg  Pulse 96  Temp(Src) 97.9 F (36.6 C) (Oral)  Resp 16  Wt 210 lb 6.4 oz (95.437 kg)  SpO2 98%  Physical Exam  Constitutional: He appears well-developed and well-nourished. No distress.  HENT:  Head: Normocephalic and atraumatic.  Right Ear: Hearing, tympanic membrane, external ear and ear canal normal. Tympanic membrane is not erythematous and not bulging.  Left Ear: Hearing, tympanic membrane, external ear and ear canal normal. Tympanic membrane is not erythematous and not bulging.  Nose: Mucosal edema and rhinorrhea present. Right sinus exhibits maxillary sinus tenderness. Right sinus exhibits  no frontal sinus tenderness. Left sinus exhibits maxillary sinus tenderness. Left sinus exhibits no frontal sinus tenderness.  Mouth/Throat: Uvula is midline, oropharynx is clear and moist and mucous membranes are normal. No oropharyngeal exudate, posterior oropharyngeal edema or posterior oropharyngeal erythema.  Eyes: Conjunctivae and EOM are normal. Pupils are equal, round, and reactive to  light. Right eye exhibits no discharge. Left eye exhibits no discharge.  Neck: Normal range of motion. Neck supple. No tracheal deviation present. No Brudzinski's sign and no Kernig's sign noted. No thyromegaly present.  Cardiovascular: Normal rate, regular rhythm and normal heart sounds.  Exam reveals no gallop and no friction rub.   No murmur heard. Pulmonary/Chest: Effort normal and breath sounds normal. No stridor. No respiratory distress. He has no wheezes. He has no rales.  Lymphadenopathy:    He has no cervical adenopathy.  Skin: Skin is warm and dry. He is not diaphoretic.  Vitals reviewed.       Assessment & Plan:     1. Flu-like symptoms Flu test was negative today in the office. I still feel this is an URI with acute exacerbation of his reactive airway disease. Will switch from doxycycline to augmentin as below since he does not feel doxycycline is working at all.  I did discuss that is would take longer than 2 days for effectiveness.  He may take tylenol and/or IBU as needed for fevers and pain. Tessalon perles were given for daytime cough suppression. States he does not have nighttime cough. Continue inhalers for rescue inhalation. Will give prednsione 12-day taper for nighttime wheezing and chest tightness. He is to call if no improvement in cough.  May consider CXR and/or spirometry if no improvement. - POCT Influenza A/B - amoxicillin-clavulanate (AUGMENTIN) 875-125 MG tablet; Take 1 tablet by mouth 2 (two) times daily.  Dispense: 20 tablet; Refill: 0  2. Cough See above medical treatment plan. - benzonatate (TESSALON) 200 MG capsule; Take 1 capsule (200 mg total) by mouth 3 (three) times daily as needed for cough.  Dispense: 30 capsule; Refill: 0 - predniSONE (DELTASONE) 10 MG tablet; Take 6 tabs PO on day 1&2, 5 tabs PO on day 3&4, 4 tabs PO on day 5&6, 3 tabs PO on day 7&8, 2 tabs PO on day 9&10, 1 tab PO on day 11&12.  Dispense: 42 tablet; Refill: 0 -  amoxicillin-clavulanate (AUGMENTIN) 875-125 MG tablet; Take 1 tablet by mouth 2 (two) times daily.  Dispense: 20 tablet; Refill: 0  3. Wheezing See above medical treatment plan. - predniSONE (DELTASONE) 10 MG tablet; Take 6 tabs PO on day 1&2, 5 tabs PO on day 3&4, 4 tabs PO on day 5&6, 3 tabs PO on day 7&8, 2 tabs PO on day 9&10, 1 tab PO on day 11&12.  Dispense: 42 tablet; Refill: 0 - amoxicillin-clavulanate (AUGMENTIN) 875-125 MG tablet; Take 1 tablet by mouth 2 (two) times daily.  Dispense: 20 tablet; Refill: 0       Margaretann Loveless, PA-C  [facility]

## 2015-04-06 NOTE — Patient Instructions (Signed)

## 2015-08-03 ENCOUNTER — Other Ambulatory Visit: Payer: Self-pay | Admitting: Family Medicine

## 2015-10-25 ENCOUNTER — Other Ambulatory Visit: Payer: Self-pay | Admitting: Family Medicine

## 2015-10-25 DIAGNOSIS — J45909 Unspecified asthma, uncomplicated: Secondary | ICD-10-CM

## 2015-10-31 ENCOUNTER — Encounter: Payer: Self-pay | Admitting: Physician Assistant

## 2015-10-31 ENCOUNTER — Ambulatory Visit (INDEPENDENT_AMBULATORY_CARE_PROVIDER_SITE_OTHER): Payer: BLUE CROSS/BLUE SHIELD | Admitting: Physician Assistant

## 2015-10-31 VITALS — BP 122/78 | HR 80 | Temp 98.5°F | Resp 20 | Wt 223.0 lb

## 2015-10-31 DIAGNOSIS — J4540 Moderate persistent asthma, uncomplicated: Secondary | ICD-10-CM | POA: Diagnosis not present

## 2015-10-31 DIAGNOSIS — Z136 Encounter for screening for cardiovascular disorders: Secondary | ICD-10-CM | POA: Diagnosis not present

## 2015-10-31 DIAGNOSIS — E162 Hypoglycemia, unspecified: Secondary | ICD-10-CM

## 2015-10-31 DIAGNOSIS — Z13220 Encounter for screening for lipoid disorders: Secondary | ICD-10-CM

## 2015-10-31 MED ORDER — FLUTICASONE-SALMETEROL 500-50 MCG/DOSE IN AEPB
1.0000 | INHALATION_SPRAY | Freq: Two times a day (BID) | RESPIRATORY_TRACT | 11 refills | Status: DC
Start: 2015-10-31 — End: 2016-12-23

## 2015-10-31 NOTE — Patient Instructions (Signed)
Asthma, Adult Asthma is a recurring condition in which the airways tighten and narrow. Asthma can make it difficult to breathe. It can cause coughing, wheezing, and shortness of breath. Asthma episodes, also called asthma attacks, range from minor to life-threatening. Asthma cannot be cured, but medicines and lifestyle changes can help control it. CAUSES Asthma is believed to be caused by inherited (genetic) and environmental factors, but its exact cause is unknown. Asthma may be triggered by allergens, lung infections, or irritants in the air. Asthma triggers are different for each person. Common triggers include:   Animal dander.  Dust mites.  Cockroaches.  Pollen from trees or grass.  Mold.  Smoke.  Air pollutants such as dust, household cleaners, hair sprays, aerosol sprays, paint fumes, strong chemicals, or strong odors.  Cold air, weather changes, and winds (which increase molds and pollens in the air).  Strong emotional expressions such as crying or laughing hard.  Stress.  Certain medicines (such as aspirin) or types of drugs (such as beta-blockers).  Sulfites in foods and drinks. Foods and drinks that may contain sulfites include dried fruit, potato chips, and sparkling grape juice.  Infections or inflammatory conditions such as the flu, a cold, or an inflammation of the nasal membranes (rhinitis).  Gastroesophageal reflux disease (GERD).  Exercise or strenuous activity. SYMPTOMS Symptoms may occur immediately after asthma is triggered or many hours later. Symptoms include:  Wheezing.  Excessive nighttime or early morning coughing.  Frequent or severe coughing with a common cold.  Chest tightness.  Shortness of breath. DIAGNOSIS  The diagnosis of asthma is made by a review of your medical history and a physical exam. Tests may also be performed. These may include:  Lung function studies. These tests show how much air you breathe in and out.  Allergy  tests.  Imaging tests such as X-rays. TREATMENT  Asthma cannot be cured, but it can usually be controlled. Treatment involves identifying and avoiding your asthma triggers. It also involves medicines. There are 2 classes of medicine used for asthma treatment:   Controller medicines. These prevent asthma symptoms from occurring. They are usually taken every day.  Reliever or rescue medicines. These quickly relieve asthma symptoms. They are used as needed and provide short-term relief. Your health care provider will help you create an asthma action plan. An asthma action plan is a written plan for managing and treating your asthma attacks. It includes a list of your asthma triggers and how they may be avoided. It also includes information on when medicines should be taken and when their dosage should be changed. An action plan may also involve the use of a device called a peak flow meter. A peak flow meter measures how well the lungs are working. It helps you monitor your condition. HOME CARE INSTRUCTIONS   Take medicines only as directed by your health care provider. Speak with your health care provider if you have questions about how or when to take the medicines.  Use a peak flow meter as directed by your health care provider. Record and keep track of readings.  Understand and use the action plan to help minimize or stop an asthma attack without needing to seek medical care.  Control your home environment in the following ways to help prevent asthma attacks:  Do not smoke. Avoid being exposed to secondhand smoke.  Change your heating and air conditioning filter regularly.  Limit your use of fireplaces and wood stoves.  Get rid of pests (such as roaches   and mice) and their droppings.  Throw away plants if you see mold on them.  Clean your floors and dust regularly. Use unscented cleaning products.  Try to have someone else vacuum for you regularly. Stay out of rooms while they are  being vacuumed and for a short while afterward. If you vacuum, use a dust mask from a hardware store, a double-layered or microfilter vacuum cleaner bag, or a vacuum cleaner with a HEPA filter.  Replace carpet with wood, tile, or vinyl flooring. Carpet can trap dander and dust.  Use allergy-proof pillows, mattress covers, and box spring covers.  Wash bed sheets and blankets every week in hot water and dry them in a dryer.  Use blankets that are made of polyester or cotton.  Clean bathrooms and kitchens with bleach. If possible, have someone repaint the walls in these rooms with mold-resistant paint. Keep out of the rooms that are being cleaned and painted.  Wash hands frequently. SEEK MEDICAL CARE IF:   You have wheezing, shortness of breath, or a cough even if taking medicine to prevent attacks.  The colored mucus you cough up (sputum) is thicker than usual.  Your sputum changes from clear or white to yellow, green, gray, or bloody.  You have any problems that may be related to the medicines you are taking (such as a rash, itching, swelling, or trouble breathing).  You are using a reliever medicine more than 2-3 times per week.  Your peak flow is still at 50-79% of your personal best after following your action plan for 1 hour.  You have a fever. SEEK IMMEDIATE MEDICAL CARE IF:   You seem to be getting worse and are unresponsive to treatment during an asthma attack.  You are short of breath even at rest.  You get short of breath when doing very little physical activity.  You have difficulty eating, drinking, or talking due to asthma symptoms.  You develop chest pain.  You develop a fast heartbeat.  You have a bluish color to your lips or fingernails.  You are light-headed, dizzy, or faint.  Your peak flow is less than 50% of your personal best.   This information is not intended to replace advice given to you by your health care provider. Make sure you discuss any  questions you have with your health care provider.   Document Released: 02/05/2005 Document Revised: 10/27/2014 Document Reviewed: 09/04/2012 Elsevier Interactive Patient Education 2016 [employer]  

## 2015-10-31 NOTE — Progress Notes (Signed)
Patient: Vincent Melendez Male    DOB: 05/20/1975   40 y.o.   MRN: [id number] Visit Date: 10/31/2015  Today's Provider: Margaretann LovelessJennifer M Kadie Balestrieri, PA-C   Chief Complaint  Patient presents with  . Asthma  . Hypoglycemia   Subjective:    HPI Patient comes in today to discuss possibly changing or increasing his dose of Advair. He reports that he does currently have a refill of Advair 250/50 at the pharmacy, but the copay is about $300. He reports that due to being out of medication, his neighbor let him try a 500/50 dose. He reports that he felt his best when he was taking the 500/4750mcg dose.   Patient reports that he has felt really fatigued and more short of breath since he has not been taking his medication at a steady dose. He is requesting that this med be possibly changed to another medication due to cost. Patient reports that if he has to stay on Advair, he would like to start 500/7050mcg instead. He reports that with his insurance he has to pay a $2000 deductible for branded medications.   Patient also mentions that he has had issues with his blood sugar dropping. Symptoms are shaking, jittery feeling and headache. This has been occurring occasionally for the last 1-2 years. 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 He notices symptoms occur after eating foods that have a high sugar content also.  He does report that if he skips a meal it is worse also. He has had his HgBA1c checked once, but this has been at least 2 years ago.    Allergies  Allergen Reactions  . Codeine Itching     Current Outpatient Prescriptions:  .  ADVAIR DISKUS 250-50 MCG/DOSE AEPB, INHALE 1 PUFF INTO THE LUNGS EVERY 12 HOURS, Disp: 60 each, Rfl: 1 .  albuterol (VENTOLIN HFA) 108 (90 BASE) MCG/ACT inhaler, Inhale into the lungs., Disp: , Rfl:  .   Aspirin-Acetaminophen-Caffeine (EXCEDRIN MIGRAINE PO), Take by mouth as needed. Reported on 04/04/2015, Disp: , Rfl:  .  diphenhydrAMINE (BENADRYL) 25 MG tablet, Take by mouth. Reported on 04/04/2015, Disp: , Rfl:  .  loratadine (CLARITIN REDITABS) 10 MG dissolvable tablet, Take by mouth., Disp: , Rfl:  .  mometasone (ELOCON) 0.1 % cream, Apply 1 application topically daily., Disp: 45 g, Rfl: 0 .  montelukast (SINGULAIR) 10 MG tablet, TAKE 1 TABLET(10 MG) BY MOUTH AT BEDTIME, Disp: 30 tablet, Rfl: 3 .  fluticasone (FLONASE ALLERGY RELIEF) 50 MCG/ACT nasal spray, Place into the nose. Reported on 04/04/2015, Disp: , Rfl:   Review of Systems  Constitutional: Positive for fatigue. Negative for activity change, appetite change, chills, diaphoresis, fever and unexpected weight change.  Eyes: Negative for visual disturbance.  Respiratory: Positive for cough, chest tightness, shortness of breath and wheezing. Negative for apnea, choking and stridor.   Cardiovascular: Negative.   Gastrointestinal: Negative for abdominal pain, constipation, diarrhea and nausea.  Musculoskeletal: Negative.   Allergic/Immunologic: Positive for environmental allergies.  Neurological: Positive for dizziness and headaches.       Comes and goes. Patient reports that he has has symptoms once blood sugar drops.     Social History  Substance Use Topics  . Smoking status: Never Smoker  . Smokeless tobacco: Never Used  . Alcohol use Yes  Comment: occasionally   Objective:   BP 122/78 (BP Location: Right Arm, Patient Position: Sitting, Cuff Size: Normal)   Pulse 80   Temp 98.5 F (36.9 C)   Resp 20   Wt 223 lb (101.2 kg)   SpO2 94%   BMI 32.93 kg/m   Physical Exam  Constitutional: He appears well-developed and well-nourished. No distress.  HENT:  Head: Normocephalic and atraumatic.  Neck: Normal range of motion. Neck supple. No tracheal deviation present. No thyromegaly present.  Cardiovascular: Normal rate,  regular rhythm and normal heart sounds.  Exam reveals no gallop and no friction rub.   No murmur heard. Pulmonary/Chest: Effort normal and breath sounds normal. No respiratory distress. He has no wheezes. He has no rales.  Lymphadenopathy:    He has no cervical adenopathy.  Skin: He is not diaphoretic.  Psychiatric: He has a normal mood and affect. His behavior is normal. Judgment and thought content normal.  Vitals reviewed.     Assessment & Plan:     1. Hypoglycemia Will check labs as below and f/u pending results. R/O diabetes as cause for hypoglycemic episodes. Will also check anemia, dehydration, and thyroid.  - CBC with Differential - Comprehensive Metabolic Panel (CMET) - TSH - HgB A1c  2. Airway hyperreactivity, moderate persistent, uncomplicated Medication increased per patient having better response with higher dose. Advised for him to call if expensive once he changes his insurance.  - Fluticasone-Salmeterol (ADVAIR) 500-50 MCG/DOSE AEPB; Inhale 1 puff into the lungs 2 (two) times daily.  Dispense: 60 each; Refill: 11  3. Encounter for lipid screening for cardiovascular disease Will check labs as below and f/u pending results. - Lipid Profile       Margaretann Loveless, PA-C  [facility]

## 2015-11-02 ENCOUNTER — Encounter: Payer: Self-pay | Admitting: Physician Assistant

## 2015-11-02 LAB — CBC WITH DIFFERENTIAL/PLATELET
BASOS ABS: 0 10*3/uL (ref 0.0–0.2)
Basos: 0 %
EOS (ABSOLUTE): 0.2 10*3/uL (ref 0.0–0.4)
EOS: 4 %
HEMATOCRIT: 43.1 % (ref 37.5–51.0)
Hemoglobin: 15 g/dL (ref 12.6–17.7)
IMMATURE GRANULOCYTES: 0 %
Immature Grans (Abs): 0 10*3/uL (ref 0.0–0.1)
Lymphocytes Absolute: 1.4 10*3/uL (ref 0.7–3.1)
Lymphs: 29 %
MCH: 30.5 pg (ref 26.6–33.0)
MCHC: 34.8 g/dL (ref 31.5–35.7)
MCV: 88 fL (ref 79–97)
MONOS ABS: 0.4 10*3/uL (ref 0.1–0.9)
Monocytes: 8 %
NEUTROS PCT: 59 %
Neutrophils Absolute: 2.9 10*3/uL (ref 1.4–7.0)
PLATELETS: 216 10*3/uL (ref 150–379)
RBC: 4.91 x10E6/uL (ref 4.14–5.80)
RDW: 13.1 % (ref 12.3–15.4)
WBC: 4.9 10*3/uL (ref 3.4–10.8)

## 2015-11-02 LAB — COMPREHENSIVE METABOLIC PANEL
ALT: 27 IU/L (ref 0–44)
AST: 16 IU/L (ref 0–40)
Albumin/Globulin Ratio: 2 (ref 1.2–2.2)
Albumin: 4.5 g/dL (ref 3.5–5.5)
Alkaline Phosphatase: 58 IU/L (ref 39–117)
BUN/Creatinine Ratio: 20 (ref 9–20)
BUN: 18 mg/dL (ref 6–24)
Bilirubin Total: 0.4 mg/dL (ref 0.0–1.2)
CO2: 25 mmol/L (ref 18–29)
CREATININE: 0.9 mg/dL (ref 0.76–1.27)
Calcium: 9.3 mg/dL (ref 8.7–10.2)
Chloride: 98 mmol/L (ref 96–106)
GFR calc Af Amer: 123 mL/min/{1.73_m2} (ref >59)
GFR, EST NON AFRICAN AMERICAN: 106 mL/min/{1.73_m2} (ref >59)
GLOBULIN, TOTAL: 2.2 g/dL (ref 1.5–4.5)
GLUCOSE: 108 mg/dL — AB (ref 65–99)
Potassium: 4.9 mmol/L (ref 3.5–5.2)
Sodium: 138 mmol/L (ref 134–144)
Total Protein: 6.7 g/dL (ref 6.0–8.5)

## 2015-11-02 LAB — LIPID PANEL
CHOLESTEROL TOTAL: 157 mg/dL (ref 100–199)
Chol/HDL Ratio: 5.6 ratio units — ABNORMAL HIGH (ref 0.0–5.0)
HDL: 28 mg/dL — AB (ref >39)
LDL Calculated: 84 mg/dL (ref 0–99)
TRIGLYCERIDES: 226 mg/dL — AB (ref 0–149)
VLDL Cholesterol Cal: 45 mg/dL — ABNORMAL HIGH (ref 5–40)

## 2015-11-02 LAB — TSH: TSH: 2.04 u[IU]/mL (ref 0.450–4.500)

## 2015-11-02 LAB — HEMOGLOBIN A1C
ESTIMATED AVERAGE GLUCOSE: 123 mg/dL
HEMOGLOBIN A1C: 5.9 % — AB (ref 4.8–5.6)

## 2015-12-08 ENCOUNTER — Other Ambulatory Visit: Payer: Self-pay | Admitting: Family Medicine

## 2016-02-20 ENCOUNTER — Ambulatory Visit
Admission: EM | Admit: 2016-02-20 | Discharge: 2016-02-20 | Disposition: A | Discharge status: Home / Self Care | Payer: BC Managed Care – PPO | Attending: Emergency Medicine | Admitting: Emergency Medicine

## 2016-02-20 DIAGNOSIS — R69 Illness, unspecified: Secondary | ICD-10-CM | POA: Diagnosis not present

## 2016-02-20 DIAGNOSIS — J111 Influenza due to unidentified influenza virus with other respiratory manifestations: Secondary | ICD-10-CM

## 2016-02-20 DIAGNOSIS — R6889 Other general symptoms and signs: Secondary | ICD-10-CM

## 2016-02-20 HISTORY — DX: Unspecified asthma, uncomplicated: J45.909

## 2016-02-20 LAB — RAPID INFLUENZA A&B ANTIGENS
Influenza A (ARMC): NEGATIVE
Influenza B (ARMC): NEGATIVE

## 2016-02-20 MED ORDER — OSELTAMIVIR PHOSPHATE 75 MG PO CAPS: 75.0000 mg | ORAL_CAPSULE | Freq: Two times a day (BID) | ORAL | 0 refills | Status: DC

## 2016-02-20 MED ORDER — METHYLPREDNISOLONE ACETATE 80 MG/ML IJ SUSP: 80.0000 mg | Freq: Once | INTRAMUSCULAR | Status: DC

## 2016-02-20 MED ORDER — IBUPROFEN 800 MG PO TABS: 800.0000 mg | ORAL_TABLET | Freq: Three times a day (TID) | ORAL | 0 refills | Status: DC

## 2016-02-20 NOTE — Discharge Instructions (Signed)
Your rapid flu was negative today, however, I'm treating you as if you do have the flu. push fluids,  ibuprofen 800 mg 1 g of Tylenol 3 times a day for body aches and fever.  use your albuterol with spacer as needed for coughing, wheezing, shortness of breath

## 2016-02-20 NOTE — ED Provider Notes (Signed)
HPI  SUBJECTIVE:  Vincent Melendez is a 41 y.o. male who presents with acute onset of body aches, headache, fatigue, decreased appetite starting this morning, several hours prior to arrival. Patient states that he is tolerating by mouth. No aggravating or alleviating factors. He has not tried anything for this. Pt denies rhinorrhea, postnasal drip, ST,  cough, fevers.  No  N/V, photophobia, neck stiffness, ear pain, sinus pain/pressure, chest pain, wheeze, SOB, abd pain, back pain. No rash, urinary c/o, skin infection,  Did not get flu shot this year. Has not taken antipyretic in the past 4-6 hours. Has had sick contacts with similar symptoms- son recently diagnosed with flu. Patient states this feels identical to the flu that he had last year. He has a past medical history of asthma. No history of smoking, diabetes, hypertension, immunocompromise, HIV, steroid use, UTIs. PMD: [facility]. Margaretann LovelessJennifer M Burnette, PA-C   Past Medical History:  Diagnosis Date  . Asthma     Past Surgical History:  Procedure Laterality Date  . INNER EAR SURGERY Right   . VASECTOMY      Family History  Problem Relation Age of Onset  . Fibromyalgia Mother   . COPD Father   . Kidney disease Father   . Healthy Sister   . Healthy Sister   . Arthritis Sister   . Healthy Sister   . Bipolar disorder Sister   . Healthy Sister   . Asthma Brother   . Anxiety disorder Brother   . Healthy Brother   . Healthy Brother   . Healthy Brother     Social History  Substance Use Topics  . Smoking status: Never Smoker  . Smokeless tobacco: Never Used  . Alcohol use Yes     Comment: occasionally    No current facility-administered medications for this encounter.   Current Outpatient Prescriptions:  .  albuterol (VENTOLIN HFA) 108 (90 BASE) MCG/ACT inhaler, Inhale into the lungs., Disp: , Rfl:  .  Fluticasone-Salmeterol (ADVAIR) 500-50 MCG/DOSE AEPB, Inhale 1 puff into the lungs 2 (two) times daily.,  Disp: 60 each, Rfl: 11 .  montelukast (SINGULAIR) 10 MG tablet, TAKE 1 TABLET(10 MG) BY MOUTH AT BEDTIME, Disp: 90 tablet, Rfl: 1 .  Aspirin-Acetaminophen-Caffeine (EXCEDRIN MIGRAINE PO), Take by mouth as needed. Reported on 04/04/2015, Disp: , Rfl:  .  fluticasone (FLONASE ALLERGY RELIEF) 50 MCG/ACT nasal spray, Place into the nose. Reported on 04/04/2015, Disp: , Rfl:  .  ibuprofen (ADVIL,MOTRIN) 800 MG tablet, Take 1 tablet (800 mg total) by mouth 3 (three) times daily., Disp: 30 tablet, Rfl: 0 .  loratadine (CLARITIN REDITABS) 10 MG dissolvable tablet, Take by mouth., Disp: , Rfl:  .  mometasone (ELOCON) 0.1 % cream, Apply 1 application topically daily., Disp: 45 g, Rfl: 0 .  oseltamivir (TAMIFLU) 75 MG capsule, Take 1 capsule (75 mg total) by mouth 2 (two) times daily. X 5 days, Disp: 10 capsule, Rfl: 0  Allergies  Allergen Reactions  . Codeine Itching     ROS  As noted in HPI.   Physical Exam  BP 111/79 (BP Location: Left Arm)   Pulse (!) 120   Temp 98.8 F (37.1 C) (Oral)   Resp 15   Ht 5\' 9"  (1.753 m)   Wt 212 lb (96.2 kg)   SpO2 100%   BMI 31.31 kg/m   Constitutional: Well developed, well nourished, no acute distress Eyes:  EOMI, conjunctiva normal bilaterally HENT: Normocephalic, atraumatic,mucus membranes moist. No nasal congestion.  Oropharynx normal. No postnasal drip. Neck: No cervical lymphadenopathy, meningismus Respiratory: Normal inspiratory effort lungs clear bilaterally, good air movement Cardiovascular: Regular tachycardia, no murmurs, rubs, gallops GI: Soft, nontender, nondistended. No tenderness. Active bowel sounds.  Back: No CVA tenderness skin: No rash, skin intact Musculoskeletal: no deformities Neurologic: Alert & oriented x 3, no focal neuro deficits Psychiatric: Speech and behavior appropriate   ED Course   Medications - No data to display  Orders Placed This Encounter  Procedures  . Rapid Influenza A&B Antigens ([facility] only)    Standing  Status:   Standing    Number of Occurrences:   1  . Droplet precaution    Standing Status:   Standing    Number of Occurrences:   1    Results for orders placed or performed during the hospital encounter of 02/20/16 (from the past 24 hour(s))  Rapid Influenza A&B Antigens ([facility] only)     Status: None   Collection Time: 02/20/16  1:33 PM  Result Value Ref Range   Influenza A ([facility]) NEGATIVE NEGATIVE   Influenza B ([facility]) NEGATIVE NEGATIVE   No results found.  ED Clinical Impression  Flu-like symptoms  Influenza-like illness   ED Assessment/Plan  Rapid flu negative, however, his presentation is most consistent with influenza. Doubt otitis, meningitis, sinusitis, pneumonia, intra-abdominal process, UTI. advised patient to push fluids, sent home with ibuprofen 800 mg 1 g of Tylenol 3 times a day, Tamiflu prescription because he has asthma. Advised patient to use his albuterol with spacer as needed for coughing, wheezing, shortness of breath. He states that he does not need a prescription. Follow up with PMD in several days, to the ER if he gets worse.  Discussed labs, MDM, plan and followup with patient. Discussed sn/sx that should prompt return to the ED. Patient agrees with plan.   Meds ordered this encounter  Medications  . DISCONTD: methylPREDNISolone acetate (DEPO-MEDROL) injection 80 mg  . oseltamivir (TAMIFLU) 75 MG capsule    Sig: Take 1 capsule (75 mg total) by mouth 2 (two) times daily. X 5 days    Dispense:  10 capsule    Refill:  0  . ibuprofen (ADVIL,MOTRIN) 800 MG tablet    Sig: Take 1 tablet (800 mg total) by mouth 3 (three) times daily.    Dispense:  30 tablet    Refill:  0    *This clinic note was created using Scientist, clinical (histocompatibility and immunogenetics). Therefore, there may be occasional mistakes despite careful proofreading.  ?   Domenick Gong, MD 02/20/16 [phone]

## 2016-02-20 NOTE — ED Triage Notes (Signed)
Patient states that he woke this morning and started having body aches and felt feverish and chills. Patient denies any other symptoms.

## 2016-03-29 ENCOUNTER — Telehealth: Payer: Self-pay

## 2016-03-29 MED ORDER — BRIMONIDINE TARTRATE 0.33 % EX GEL: CUTANEOUS | 5 refills | Status: DC

## 2016-03-29 NOTE — Telephone Encounter (Signed)
[facility] [address] [place]

## 2016-03-29 NOTE — Telephone Encounter (Signed)
Refill request for prescription.  Medication: Mirvaso 0.33% GEL 30 MG Qty:30 Sig. Apply externally to the affected area daily

## 2016-04-19 ENCOUNTER — Telehealth: Payer: Self-pay

## 2016-04-19 NOTE — Telephone Encounter (Signed)
Patient called to let us know that Brimonidine cream needs PA per pharmacy. I think I saw a form for PA or request earlier sent to you. Please let patient know when this gets sent to insurance so he can know. He is out of the cream. Thank you. Please review-aa

## 2016-04-20 NOTE — Telephone Encounter (Signed)
This is the patient I was asking about this morning. I haven't seen this. It was refilled on 03/29/16.  May need to contact his pharmacy, please. Thanks!

## 2016-04-23 NOTE — Telephone Encounter (Signed)
Patient advised that PA was approved.  Thanks,  -Joseline

## 2016-07-16 ENCOUNTER — Other Ambulatory Visit: Payer: Self-pay | Admitting: Physician Assistant

## 2016-07-17 NOTE — Telephone Encounter (Signed)
LOV 10/31/2015. [name], CMA  

## 2016-08-07 ENCOUNTER — Ambulatory Visit: Payer: BLUE CROSS/BLUE SHIELD | Admitting: Physician Assistant

## 2016-08-08 ENCOUNTER — Encounter: Payer: Self-pay | Admitting: Physician Assistant

## 2016-08-08 ENCOUNTER — Ambulatory Visit (INDEPENDENT_AMBULATORY_CARE_PROVIDER_SITE_OTHER): Payer: BC Managed Care – PPO | Admitting: Physician Assistant

## 2016-08-08 VITALS — BP 128/80 | HR 88 | Temp 98.4°F | Resp 16 | Ht 70.0 in | Wt 216.0 lb

## 2016-08-08 DIAGNOSIS — Z125 Encounter for screening for malignant neoplasm of prostate: Secondary | ICD-10-CM | POA: Diagnosis not present

## 2016-08-08 DIAGNOSIS — J4520 Mild intermittent asthma, uncomplicated: Secondary | ICD-10-CM | POA: Diagnosis not present

## 2016-08-08 DIAGNOSIS — R739 Hyperglycemia, unspecified: Secondary | ICD-10-CM | POA: Diagnosis not present

## 2016-08-08 DIAGNOSIS — E781 Pure hyperglyceridemia: Secondary | ICD-10-CM

## 2016-08-08 DIAGNOSIS — Z6830 Body mass index (BMI) 30.0-30.9, adult: Secondary | ICD-10-CM | POA: Diagnosis not present

## 2016-08-08 LAB — POCT GLYCOSYLATED HEMOGLOBIN (HGB A1C)
Est. average glucose Bld gHb Est-mCnc: 126
Hemoglobin A1C: 6

## 2016-08-08 NOTE — Patient Instructions (Signed)

## 2016-08-08 NOTE — Progress Notes (Signed)
Patient: Vincent Melendez Male    DOB: 04/07/1975   41 y.o.   MRN: [id number] Visit Date: 08/08/2016  Today's Provider: Margaretann LovelessJennifer M Burnette, PA-C   Chief Complaint  Patient presents with  . Hyperglycemia   Subjective:    HPI  Hyperglycemia, Follow-up:   Lab Results  Component Value Date   HGBA1C 6.0 08/08/2016   HGBA1C 5.9 (H) 11/01/2015   GLUCOSE 108 (H) 11/01/2015    Last seen for for this 9 months ago.  Management since then includes no changes. Current symptoms include none and have been stable.  Weight trend: stable Prior visit with dietician: no Current diet: well balanced Current exercise: aerobics and walking He just started exercise back on Monday. Trying to limit portion sizes and eat healthier.  Pertinent Labs:    Component Value Date/Time   CHOL 157 11/01/2015 0829   TRIG 226 (H) 11/01/2015 0829   CHOLHDL 5.6 (H) 11/01/2015 0829   CREATININE 0.90 11/01/2015 0829    Wt Readings from Last 3 Encounters:  08/08/16 216 lb (98 kg)  02/20/16 212 lb (96.2 kg)  10/31/15 223 lb (101.2 kg)      Allergies  Allergen Reactions  . Codeine Itching     Current Outpatient Prescriptions:  .  albuterol (VENTOLIN HFA) 108 (90 BASE) MCG/ACT inhaler, Inhale into the lungs., Disp: , Rfl:  .  Aspirin-Acetaminophen-Caffeine (EXCEDRIN MIGRAINE PO), Take by mouth as needed. Reported on 04/04/2015, Disp: , Rfl:  .  Brimonidine Tartrate 0.33 % GEL, Apply a small amount to affected area once daily prn, Disp: 30 g, Rfl: 5 .  fluticasone (FLONASE ALLERGY RELIEF) 50 MCG/ACT nasal spray, Place into the nose. Reported on 04/04/2015, Disp: , Rfl:  .  Fluticasone-Salmeterol (ADVAIR) 500-50 MCG/DOSE AEPB, Inhale 1 puff into the lungs 2 (two) times daily., Disp: 60 each, Rfl: 11 .  ibuprofen (ADVIL,MOTRIN) 800 MG tablet, Take 1 tablet (800 mg total) by mouth 3 (three) times daily., Disp: 30 tablet, Rfl: 0 .  loratadine (CLARITIN REDITABS) 10 MG dissolvable tablet, Take by  mouth., Disp: , Rfl:  .  mometasone (ELOCON) 0.1 % cream, Apply 1 application topically daily., Disp: 45 g, Rfl: 0 .  montelukast (SINGULAIR) 10 MG tablet, TAKE 1 TABLET(10 MG) BY MOUTH AT BEDTIME, Disp: 90 tablet, Rfl: 1 .  oseltamivir (TAMIFLU) 75 MG capsule, Take 1 capsule (75 mg total) by mouth 2 (two) times daily. X 5 days (Patient not taking: Reported on 08/08/2016), Disp: 10 capsule, Rfl: 0  Review of Systems  Constitutional: Negative.   Respiratory: Negative.   Cardiovascular: Negative.   Endocrine: Negative.   Musculoskeletal: Negative.   Neurological: Negative.     Social History  Substance Use Topics  . Smoking status: Never Smoker  . Smokeless tobacco: Never Used  . Alcohol use Yes     Comment: occasionally   Objective:   BP 128/80 (BP Location: Right Arm, Patient Position: Sitting, Cuff Size: Large)   Pulse 88   Temp 98.4 F (36.9 C)   Resp 16   Ht 5\' 10"  (1.778 m)   Wt 216 lb (98 kg)   SpO2 98%   BMI 30.99 kg/m  Vitals:   08/08/16 0845  BP: 128/80  Pulse: 88  Resp: 16  Temp: 98.4 F (36.9 C)  SpO2: 98%  Weight: 216 lb (98 kg)  Height: 5\' 10"  (1.778 m)     Physical Exam  Constitutional: He appears well-developed and well-nourished. No distress.  HENT:  Head: Normocephalic and atraumatic.  Neck: Normal range of motion. Neck supple.  Cardiovascular: Normal rate, regular rhythm and normal heart sounds.  Exam reveals no gallop and no friction rub.   No murmur heard. Pulmonary/Chest: Effort normal and breath sounds normal. No respiratory distress. He has no wheezes. He has no rales.  Musculoskeletal: He exhibits no edema.  Skin: He is not diaphoretic.  Vitals reviewed.      Assessment & Plan:     1. Hyperglycemia A1c increased to 6.0. Discussed healthy dieting. Information on carb counting given to patient in printed form. Will check other labs as below. I will f/u pending results. I will see him back in 6 months to recheck.  - POCT glycosylated  hemoglobin (Hb A1C) - CBC w/Diff/Platelet - Comprehensive Metabolic Panel (CMET) - TSH - Lipid Profile  2. Mild intermittent reactive airway disease without complication Stable. Has not needed rescue inhaler. - CBC w/Diff/Platelet  3. Hypertriglyceridemia Total cholesterol and LDL were normal last year but triglycerides were elevated in the 200s. Will check fasting labs as below and will f/u pending results.  - CBC w/Diff/Platelet - Comprehensive Metabolic Panel (CMET) - TSH - Lipid Profile  4. BMI 30.0-30.9,adult Counseled patient on healthy lifestyle modifications including dieting and exercise. He recently started exercising on Monday. Trying to portion size better and make healthier choices.  - CBC w/Diff/Platelet - Comprehensive Metabolic Panel (CMET) - TSH - Lipid Profile  5. Prostate cancer screening - PSA       Margaretann Loveless, PA-C  [facility]

## 2016-08-10 ENCOUNTER — Other Ambulatory Visit: Payer: Self-pay | Admitting: Physician Assistant

## 2016-08-11 LAB — LIPID PANEL
CHOLESTEROL TOTAL: 155 mg/dL (ref 100–199)
Chol/HDL Ratio: 4.8 ratio (ref 0.0–5.0)
HDL: 32 mg/dL — ABNORMAL LOW (ref >39)
LDL CALC: 101 mg/dL — AB (ref 0–99)
TRIGLYCERIDES: 108 mg/dL (ref 0–149)
VLDL Cholesterol Cal: 22 mg/dL (ref 5–40)

## 2016-08-11 LAB — CBC WITH DIFFERENTIAL/PLATELET
BASOS ABS: 0 10*3/uL (ref 0.0–0.2)
Basos: 0 %
EOS (ABSOLUTE): 0.1 10*3/uL (ref 0.0–0.4)
Eos: 2 %
HEMATOCRIT: 44.1 % (ref 37.5–51.0)
Hemoglobin: 14.9 g/dL (ref 13.0–17.7)
Immature Grans (Abs): 0 10*3/uL (ref 0.0–0.1)
Immature Granulocytes: 0 %
LYMPHS ABS: 1.3 10*3/uL (ref 0.7–3.1)
Lymphs: 27 %
MCH: 30.3 pg (ref 26.6–33.0)
MCHC: 33.8 g/dL (ref 31.5–35.7)
MCV: 90 fL (ref 79–97)
Monocytes Absolute: 0.4 10*3/uL (ref 0.1–0.9)
Monocytes: 9 %
Neutrophils Absolute: 2.9 10*3/uL (ref 1.4–7.0)
Neutrophils: 62 %
Platelets: 207 10*3/uL (ref 150–379)
RBC: 4.92 x10E6/uL (ref 4.14–5.80)
RDW: 12.8 % (ref 12.3–15.4)
WBC: 4.8 10*3/uL (ref 3.4–10.8)

## 2016-08-11 LAB — PSA: PROSTATE SPECIFIC AG, SERUM: 1.1 ng/mL (ref 0.0–4.0)

## 2016-08-11 LAB — COMPREHENSIVE METABOLIC PANEL
ALT: 64 IU/L — ABNORMAL HIGH (ref 0–44)
AST: 137 IU/L — AB (ref 0–40)
Albumin/Globulin Ratio: 1.9 (ref 1.2–2.2)
Albumin: 4.5 g/dL (ref 3.5–5.5)
Alkaline Phosphatase: 56 IU/L (ref 39–117)
BILIRUBIN TOTAL: 0.6 mg/dL (ref 0.0–1.2)
BUN/Creatinine Ratio: 20 (ref 9–20)
BUN: 19 mg/dL (ref 6–24)
CHLORIDE: 100 mmol/L (ref 96–106)
CO2: 26 mmol/L (ref 20–29)
CREATININE: 0.95 mg/dL (ref 0.76–1.27)
Calcium: 9.7 mg/dL (ref 8.7–10.2)
GFR calc Af Amer: 114 mL/min/{1.73_m2} (ref >59)
GFR calc non Af Amer: 99 mL/min/{1.73_m2} (ref >59)
GLOBULIN, TOTAL: 2.4 g/dL (ref 1.5–4.5)
Glucose: 115 mg/dL — ABNORMAL HIGH (ref 65–99)
POTASSIUM: 4.4 mmol/L (ref 3.5–5.2)
SODIUM: 141 mmol/L (ref 134–144)
Total Protein: 6.9 g/dL (ref 6.0–8.5)

## 2016-08-11 LAB — TSH: TSH: 1.73 u[IU]/mL (ref 0.450–4.500)

## 2016-08-13 ENCOUNTER — Telehealth: Payer: Self-pay

## 2016-08-13 NOTE — Telephone Encounter (Signed)
-----   Message from Margaretann LovelessJennifer M Burnette, PA-C sent at 08/13/2016 11:03 AM EDT ----- Cholesterol is stable, triglycerides have improved. Liver enzymes are acutely elevated. Would recommend to limit tylenol based products, alcohol and fatty foods. We can recheck in 6 weeks. If still elevated would check hepatitis panel and get liver US to further evaluate. All other labs are stable and WNL.

## 2016-08-13 NOTE — Telephone Encounter (Signed)
Patient advised as below. Patient verbalizes understanding and is in agreement with treatment plan.  

## 2016-11-09 ENCOUNTER — Encounter: Payer: Self-pay | Admitting: Physician Assistant

## 2016-11-09 ENCOUNTER — Other Ambulatory Visit: Payer: Self-pay

## 2016-11-09 DIAGNOSIS — R748 Abnormal levels of other serum enzymes: Secondary | ICD-10-CM

## 2016-11-09 NOTE — Telephone Encounter (Signed)
Please order hepatic panel and inform patient.  Thanks! JB

## 2016-11-16 ENCOUNTER — Encounter: Payer: Self-pay | Admitting: Physician Assistant

## 2016-11-16 ENCOUNTER — Ambulatory Visit (INDEPENDENT_AMBULATORY_CARE_PROVIDER_SITE_OTHER): Payer: BC Managed Care – PPO | Admitting: Physician Assistant

## 2016-11-16 VITALS — BP 110/60 | HR 84 | Temp 98.4°F | Resp 16 | Ht 70.0 in | Wt 203.8 lb

## 2016-11-16 DIAGNOSIS — Z114 Encounter for screening for human immunodeficiency virus [HIV]: Secondary | ICD-10-CM

## 2016-11-16 DIAGNOSIS — Z2821 Immunization not carried out because of patient refusal: Secondary | ICD-10-CM

## 2016-11-16 DIAGNOSIS — Z23 Encounter for immunization: Secondary | ICD-10-CM | POA: Diagnosis not present

## 2016-11-16 DIAGNOSIS — R7989 Other specified abnormal findings of blood chemistry: Secondary | ICD-10-CM

## 2016-11-16 DIAGNOSIS — R945 Abnormal results of liver function studies: Principal | ICD-10-CM

## 2016-11-16 LAB — HEPATIC FUNCTION PANEL
AG Ratio: 2 (calc) (ref 1.0–2.5)
ALBUMIN MSPROF: 4.4 g/dL (ref 3.6–5.1)
ALKALINE PHOSPHATASE (APISO): 62 U/L (ref 40–115)
ALT: 20 U/L (ref 9–46)
AST: 16 U/L (ref 10–40)
Bilirubin, Direct: 0.1 mg/dL (ref 0.0–0.2)
Globulin: 2.2 g/dL (calc) (ref 1.9–3.7)
Indirect Bilirubin: 0.4 mg/dL (calc) (ref 0.2–1.2)
Total Bilirubin: 0.5 mg/dL (ref 0.2–1.2)
Total Protein: 6.6 g/dL (ref 6.1–8.1)

## 2016-11-16 NOTE — Progress Notes (Signed)
Patient: Vincent Melendez Male    DOB: 02/20/1976   41 y.o.   MRN: [id number] Visit Date: 11/16/2016  Today's Provider: Margaretann Loveless, PA-C   Chief Complaint  Patient presents with  . Labs Only   Subjective:    HPI Patient here today for lab recheck. Patient reports that he believes his liver enzyme is still elevated. Patient reports he has tried to drink alcoholic beverages and his face get really red with a burning sensation so he has not been drinking much alcohol. He also has not used any tylenol products recently.  Patient reports he feels a "bone sticking out" from his chest since about 3 weeks ago. Patient not sure if this is normal due to weight loss as he has lost 18 pounds recently. Patient denies any injuries or pain. Denies SOB.    Allergies  Allergen Reactions  . Codeine Itching     Current Outpatient Prescriptions:  .  albuterol (VENTOLIN HFA) 108 (90 BASE) MCG/ACT inhaler, Inhale into the lungs., Disp: , Rfl:  .  Brimonidine Tartrate 0.33 % GEL, Apply a small amount to affected area once daily prn, Disp: 30 g, Rfl: 5 .  Fluticasone-Salmeterol (ADVAIR) 500-50 MCG/DOSE AEPB, Inhale 1 puff into the lungs 2 (two) times daily., Disp: 60 each, Rfl: 11 .  ibuprofen (ADVIL,MOTRIN) 800 MG tablet, Take 1 tablet (800 mg total) by mouth 3 (three) times daily., Disp: 30 tablet, Rfl: 0 .  loratadine (CLARITIN REDITABS) 10 MG dissolvable tablet, Take by mouth., Disp: , Rfl:  .  mometasone (ELOCON) 0.1 % cream, Apply 1 application topically daily., Disp: 45 g, Rfl: 0 .  montelukast (SINGULAIR) 10 MG tablet, TAKE 1 TABLET(10 MG) BY MOUTH AT BEDTIME, Disp: 90 tablet, Rfl: 1 .  fluticasone (FLONASE ALLERGY RELIEF) 50 MCG/ACT nasal spray, Place into the nose. Reported on 04/04/2015, Disp: , Rfl:   Review of Systems  Constitutional: Negative.   Respiratory: Negative.   Cardiovascular: Negative.   Gastrointestinal: Negative.   Musculoskeletal: Negative.   Neurological:  Negative.     Social History  Substance Use Topics  . Smoking status: Never Smoker  . Smokeless tobacco: Never Used  . Alcohol use Yes     Comment: occasionally   Objective:   BP 110/60 (BP Location: Left Arm, Patient Position: Sitting, Cuff Size: Large)   Pulse 84   Temp 98.4 F (36.9 C) (Oral)   Resp 16   Ht  (1.778 m)   Wt 203 lb 12.8 oz (92.4 kg)   SpO2 98%   BMI 29.24 kg/m  Vitals:   11/16/16 1120  BP: 110/60  Pulse: 84  Resp: 16  Temp: 98.4 F (36.9 C)  TempSrc: Oral  SpO2: 98%  Weight: 203 lb 12.8 oz (92.4 kg)  Height:  (1.778 m)     Physical Exam  Constitutional: He appears well-developed and well-nourished. No distress.  HENT:  Head: Normocephalic and atraumatic.  Neck: Normal range of motion. Neck supple.  Cardiovascular: Normal rate, regular rhythm and normal heart sounds.  Exam reveals no gallop and no friction rub.   No murmur heard. Pulmonary/Chest: Effort normal and breath sounds normal. No respiratory distress. He has no wheezes. He has no rales. He exhibits deformity.    Skin: He is not diaphoretic.  Vitals reviewed.       Assessment & Plan:     1. Elevated LFTs Will check labs as below and f/u pending results. Patient has  been eating healthier, limiting alcohol consumption, and cut out all tylenol based products. Will recheck labs as below and will f/u with patient pending results. If still elevated may consider RUQ US.  - Hepatic function panKoreael  2. Need for Tdap vaccination Tdap Vaccine given to patient without complications. Patient sat for 15 minutes after administration and was tolerated well without adverse effects. - Tdap vaccine greater than or equal to 7yo IM  3. Influenza vaccination declined  4. Screening for HIV without presence of risk factors - HIV antibody (with reflex)       Margaretann Loveless, PA-C  [facility]

## 2016-11-16 NOTE — Patient Instructions (Signed)
Tdap Vaccine (Tetanus, Diphtheria and Pertussis): What You Need to Know 1. Why get vaccinated? Tetanus, diphtheria and pertussis are very serious diseases. Tdap vaccine can protect us from these diseases. And, Tdap vaccine given to pregnant women can protect newborn babies against pertussis. TETANUS (Lockjaw) is rare in the [place] today. It causes painful muscle tightening and stiffness, usually all over the body.  It can lead to tightening of muscles in the head and neck so you can't open your mouth, swallow, or sometimes even breathe. Tetanus kills about 1 out of 10 people who are infected even after receiving the best medical care.  DIPHTHERIA is also rare in the [place] today. It can cause a thick coating to form in the back of the throat.  It can lead to breathing problems, heart failure, paralysis, and death.  PERTUSSIS (Whooping Cough) causes severe coughing spells, which can cause difficulty breathing, vomiting and disturbed sleep.  It can also lead to weight loss, incontinence, and rib fractures. Up to 2 in 100 adolescents and 5 in 100 adults with pertussis are hospitalized or have complications, which could include pneumonia or death.  These diseases are caused by bacteria. Diphtheria and pertussis are spread from person to person through secretions from coughing or sneezing. Tetanus enters the body through cuts, scratches, or wounds. Before vaccines, as many as 200,000 cases of diphtheria, 200,000 cases of pertussis, and hundreds of cases of tetanus, were reported in the [place] each year. Since vaccination began, reports of cases for tetanus and diphtheria have dropped by about 99% and for pertussis by about 80%. 2. Tdap vaccine Tdap vaccine can protect adolescents and adults from tetanus, diphtheria, and pertussis. One dose of Tdap is routinely given at age 11 or 12. People who did not get Tdap at that age should get it as soon as possible. Tdap is especially  important for healthcare professionals and anyone having close contact with a baby younger than 12 months. Pregnant women should get a dose of Tdap during every pregnancy, to protect the newborn from pertussis. Infants are most at risk for severe, life-threatening complications from pertussis. Another vaccine, called Td, protects against tetanus and diphtheria, but not pertussis. A Td booster should be given every 10 years. Tdap may be given as one of these boosters if you have never gotten Tdap before. Tdap may also be given after a severe cut or burn to prevent tetanus infection. Your doctor or the person giving you the vaccine can give you more information. Tdap may safely be given at the same time as other vaccines. 3. Some people should not get this vaccine  A person who has ever had a life-threatening allergic reaction after a previous dose of any diphtheria, tetanus or pertussis containing vaccine, OR has a severe allergy to any part of this vaccine, should not get Tdap vaccine. Tell the person giving the vaccine about any severe allergies.  Anyone who had coma or long repeated seizures within 7 days after a childhood dose of DTP or DTaP, or a previous dose of Tdap, should not get Tdap, unless a cause other than the vaccine was found. They can still get Td.  Talk to your doctor if you: ? have seizures or another nervous system problem, ? had severe pain or swelling after any vaccine containing diphtheria, tetanus or pertussis, ? ever had a condition called Guillain-Barr Syndrome (GBS), ? aren't feeling well on the day the shot is scheduled. 4. Risks With any medicine, including   vaccines, there is a chance of side effects. These are usually mild and go away on their own. Serious reactions are also possible but are rare. Most people who get Tdap vaccine do not have any problems with it. Mild problems following Tdap: (Did not interfere with activities)  Pain where the shot was given (about  3 in 4 adolescents or 2 in 3 adults)  Redness or swelling where the shot was given (about 1 person in 5)  Mild fever of at least 100.4F (up to about 1 in 25 adolescents or 1 in 100 adults)  Headache (about 3 or 4 people in 10)  Tiredness (about 1 person in 3 or 4)  Nausea, vomiting, diarrhea, stomach ache (up to 1 in 4 adolescents or 1 in 10 adults)  Chills, sore joints (about 1 person in 10)  Body aches (about 1 person in 3 or 4)  Rash, swollen glands (uncommon)  Moderate problems following Tdap: (Interfered with activities, but did not require medical attention)  Pain where the shot was given (up to 1 in 5 or 6)  Redness or swelling where the shot was given (up to about 1 in 16 adolescents or 1 in 12 adults)  Fever over 102F (about 1 in 100 adolescents or 1 in 250 adults)  Headache (about 1 in 7 adolescents or 1 in 10 adults)  Nausea, vomiting, diarrhea, stomach ache (up to 1 or 3 people in 100)  Swelling of the entire arm where the shot was given (up to about 1 in 500).  Severe problems following Tdap: (Unable to perform usual activities; required medical attention)  Swelling, severe pain, bleeding and redness in the arm where the shot was given (rare).  Problems that could happen after any vaccine:  People sometimes faint after a medical procedure, including vaccination. Sitting or lying down for about 15 minutes can help prevent fainting, and injuries caused by a fall. Tell your doctor if you feel dizzy, or have vision changes or ringing in the ears.  Some people get severe pain in the shoulder and have difficulty moving the arm where a shot was given. This happens very rarely.  Any medication can cause a severe allergic reaction. Such reactions from a vaccine are very rare, estimated at fewer than 1 in a million doses, and would happen within a few minutes to a few hours after the vaccination. As with any medicine, there is a very remote chance of a vaccine  causing a serious injury or death. The safety of vaccines is always being monitored. For more information, visit: www.cdc.gov/vaccinesafety/ 5. What if there is a serious problem? What should I look for? Look for anything that concerns you, such as signs of a severe allergic reaction, very high fever, or unusual behavior. Signs of a severe allergic reaction can include hives, swelling of the face and throat, difficulty breathing, a fast heartbeat, dizziness, and weakness. These would usually start a few minutes to a few hours after the vaccination. What should I do?  If you think it is a severe allergic reaction or other emergency that can't wait, call 9-1-1 or get the person to the nearest hospital. Otherwise, call your doctor.  Afterward, the reaction should be reported to the Vaccine Adverse Event Reporting System (VAERS). Your doctor might file this report, or you can do it yourself through the VAERS web site at www.vaers.hhs.gov, or by calling 1-800-822-7967. ? VAERS does not give medical advice. 6. The National Vaccine Injury Compensation Program The National   Vaccine Injury Compensation Program (VICP) is a federal program that was created to compensate people who may have been injured by certain vaccines. Persons who believe they may have been injured by a vaccine can learn about the program and about filing a claim by calling 1-800-338-2382 or visiting the VICP website at www.hrsa.gov/vaccinecompensation. There is a time limit to file a claim for compensation. 7. How can I learn more?  Ask your doctor. He or she can give you the vaccine package insert or suggest other sources of information.  Call your local or state health department.  Contact the Centers for Disease Control and Prevention (CDC): ? Call 1-800-232-4636 (1-800-CDC-INFO) or ? Visit CDC's website at www.cdc.gov/vaccines CDC Tdap Vaccine VIS (04/14/13) This information is not intended to replace advice given to you by your  health care provider. Make sure you discuss any questions you have with your health care provider. Document Released: 08/07/2011 Document Revised: 10/27/2015 Document Reviewed: 10/27/2015 Elsevier Interactive Patient Education  2017 Elsevier Inc.  

## 2016-11-17 LAB — HIV ANTIBODY (ROUTINE TESTING W REFLEX): HIV: NONREACTIVE

## 2016-11-19 ENCOUNTER — Telehealth: Payer: Self-pay

## 2016-11-19 NOTE — Telephone Encounter (Signed)
[email or web address] 

## 2016-11-19 NOTE — Telephone Encounter (Signed)
-----   Message from Margaretann Loveless, PA-C sent at 11/17/2016  9:17 AM EDT ----- Liver enzymes are now in normal range! Keep up the good work with lifestyle changes.

## 2016-11-28 ENCOUNTER — Encounter: Payer: Self-pay | Admitting: Physician Assistant

## 2016-11-28 DIAGNOSIS — R41840 Attention and concentration deficit: Secondary | ICD-10-CM

## 2016-11-29 NOTE — Addendum Note (Signed)
Addended by: Margaretann Loveless on: 11/29/2016 11:13 AM   Modules accepted: Orders

## 2016-12-23 ENCOUNTER — Other Ambulatory Visit: Payer: Self-pay | Admitting: Physician Assistant

## 2016-12-23 DIAGNOSIS — J4540 Moderate persistent asthma, uncomplicated: Secondary | ICD-10-CM

## 2016-12-31 ENCOUNTER — Encounter: Payer: Self-pay | Admitting: Physician Assistant

## 2017-01-09 ENCOUNTER — Encounter: Payer: Self-pay | Admitting: Physician Assistant

## 2017-01-09 ENCOUNTER — Ambulatory Visit: Payer: BC Managed Care – PPO | Admitting: Physician Assistant

## 2017-01-09 VITALS — BP 118/70 | HR 100 | Temp 98.2°F | Resp 16 | Ht 70.0 in | Wt 199.0 lb

## 2017-01-09 DIAGNOSIS — F902 Attention-deficit hyperactivity disorder, combined type: Secondary | ICD-10-CM | POA: Diagnosis not present

## 2017-01-09 MED ORDER — LISDEXAMFETAMINE DIMESYLATE 40 MG PO CAPS: 40.0000 mg | ORAL_CAPSULE | ORAL | 0 refills | Status: DC

## 2017-01-09 NOTE — Patient Instructions (Signed)
Lisdexamfetamine Oral Capsule What is this medicine? LISDEXAMFETAMINE (lis DEX am fet a meen) is used to treat attention-deficit hyperactivity disorder (ADHD) in adults and children. It is also used to treat binge-eating disorder in adults. Federal law prohibits giving this medicine to any person other than the person for whom it was prescribed. Do not share this medicine with anyone else. This medicine may be used for other purposes; ask your health care provider or pharmacist if you have questions. COMMON BRAND NAME(S): Vyvanse What should I tell my health care provider before I take this medicine? They need to know if you have any of these conditions: -anxiety or panic attacks -circulation problems in fingers and toes -glaucoma -hardening or blockages of the arteries or heart blood vessels -heart disease or a heart defect -high blood pressure -history of a drug or alcohol abuse problem -history of stroke -kidney disease -liver disease -mental illness -seizures -suicidal thoughts, plans, or attempt; a previous suicide attempt by you or a family member -thyroid disease -Tourette's syndrome -an unusual or allergic reaction to lisdexamfetamine, other medicines, foods, dyes, or preservatives -pregnant or trying to get pregnant -breast-feeding How should I use this medicine? Take this medicine by mouth. Follow the directions on the prescription label. Swallow the capsules with a drink of water. You may open capsule and add to a glass of water, then drink right away. Take your doses at regular intervals. Do not take your medicine more often than directed. Do not suddenly stop your medicine. You must gradually reduce the dose or you may feel withdrawal effects. Ask your doctor or health care professional for advice. A special MedGuide will be given to you by the pharmacist with each prescription and refill. Be sure to read this information carefully each time. Talk to your pediatrician  regarding the use of this medicine in children. While this drug may be prescribed for children as young as 6 years of age for selected conditions, precautions do apply. Overdosage: If you think you have taken too much of this medicine contact a poison control center or emergency room at once. NOTE: This medicine is only for you. Do not share this medicine with others. What if I miss a dose? If you miss a dose, take it as soon as you can. If it is almost time for your next dose, take only that dose. Do not take double or extra doses. What may interact with this medicine? Do not take this medicine with any of the following medications: -MAOIs like Carbex, Eldepryl, Marplan, Nardil, and Parnate -other stimulant medicines for attention disorders, weight loss, or to stay awake This medicine may also interact with the following medications: -acetazolamide -ammonium chloride -antacids -ascorbic acid -atomoxetine -caffeine -certain medicines for blood pressure -certain medicines for depression, anxiety, or psychotic disturbances -certain medicines for seizures like carbamazepine, phenobarbital, phenytoin -certain medicines for stomach problems like cimetidine, famotidine, omeprazole, lansoprazole -cold or allergy medicines -green tea -levodopa -linezolid -medicines for sleep during surgery -methenamine -norepinephrine -phenothiazines like chlorpromazine, mesoridazine, prochlorperazine, thioridazine -propoxyphene -sodium acid phosphate -sodium bicarbonate This list may not describe all possible interactions. Give your health care provider a list of all the medicines, herbs, non-prescription drugs, or dietary supplements you use. Also tell them if you smoke, drink alcohol, or use illegal drugs. Some items may interact with your medicine. What should I watch for while using this medicine? Visit your doctor for regular check ups. This prescription requires that you follow special procedures with  your doctor and pharmacy.   You will need to have a new written prescription from your doctor every time you need a refill. This medicine may affect your concentration, or hide signs of tiredness. Until you know how this medicine affects you, do not drive, ride a bicycle, use machinery, or do anything that needs mental alertness. Tell your doctor or health care professional if this medicine loses its effects, or if you feel you need to take more than the prescribed amount. Do not change your dose without talking to your doctor or health care professional. Decreased appetite is a common side effect when starting this medicine. Eating small, frequent meals or snacks can help. Talk to your doctor if you continue to have poor eating habits. Height and weight growth of a child taking this medicine will be monitored closely. Do not take this medicine close to bedtime. It may prevent you from sleeping. If you are going to need surgery, a MRI, CT scan, or other procedure, tell your doctor that you are taking this medicine. You may need to stop taking this medicine before the procedure. Tell your doctor or healthcare professional right away if you notice unexplained wounds on your fingers and toes while taking this medicine. You should also tell your healthcare provider if you experience numbness or pain, changes in the skin color, or sensitivity to temperature in your fingers or toes. What side effects may I notice from receiving this medicine? Side effects that you should report to your doctor or health care professional as soon as possible: -allergic reactions like skin rash, itching or hives, swelling of the face, lips, or tongue -changes in vision -chest pain or chest tightness -confusion, trouble speaking or understanding -fast, irregular heartbeat -fingers or toes feel numb, cool, painful -hallucination, loss of contact with reality -high blood pressure -males: prolonged or painful  erection -seizures -severe headaches -shortness of breath -suicidal thoughts or other mood changes -trouble walking, dizziness, loss of balance or coordination -uncontrollable head, mouth, neck, arm, or leg movements Side effects that usually do not require medical attention (report to your doctor or health care professional if they continue or are bothersome): -anxious -headache -loss of appetite -nausea, vomiting -trouble sleeping -weight loss This list may not describe all possible side effects. Call your doctor for medical advice about side effects. You may report side effects to FDA at 1-800-FDA-1088. Where should I keep my medicine? Keep out of the reach of children. This medicine can be abused. Keep your medicine in a safe place to protect it from theft. Do not share this medicine with anyone. Selling or giving away this medicine is dangerous and against the law. Store at room temperature between 15 and 30 degrees C (59 and 86 degrees F). Protect from light. Keep container tightly closed. Throw away any unused medicine after the expiration date. NOTE: This sheet is a summary. It may not cover all possible information. If you have questions about this medicine, talk to your doctor, pharmacist, or health care provider.  2018 Elsevier/Gold Standard (2013-12-09 19:20:14)  

## 2017-01-09 NOTE — Progress Notes (Signed)
Patient: Vincent Melendez Male    DOB: 06/20/1975   41 y.o.   MRN: [id number] Visit Date: 01/09/2017  Today's Provider: Margaretann LovelessJennifer M Burnette, PA-C   Chief Complaint  Patient presents with  . ADHD   Subjective:    HPI Patient is here today to discuss treatments for ADHD.Patietnt was evaluated for ADHD by Elna BreslowBruce Thompson, counseling Psychologist. He was found to have ADHD: combined. He does report his son has ADHD as well and did not respond well to Adderall but has responded to Vyvanse. He is interested in trying Vyvanse as well.   Patient Declined Influenza Vaccine.    Allergies  Allergen Reactions  . Codeine Itching     Current Outpatient Medications:  .  ADVAIR DISKUS 500-50 MCG/DOSE AEPB, INHALE 1 PUFF INTO THE LUNGS TWICE DAILY, Disp: 60 each, Rfl: 0 .  albuterol (VENTOLIN HFA) 108 (90 BASE) MCG/ACT inhaler, Inhale into the lungs., Disp: , Rfl:  .  Brimonidine Tartrate 0.33 % GEL, Apply a small amount to affected area once daily prn, Disp: 30 g, Rfl: 5 .  fluticasone (FLONASE ALLERGY RELIEF) 50 MCG/ACT nasal spray, Place into the nose. Reported on 04/04/2015, Disp: , Rfl:  .  ibuprofen (ADVIL,MOTRIN) 800 MG tablet, Take 1 tablet (800 mg total) by mouth 3 (three) times daily., Disp: 30 tablet, Rfl: 0 .  loratadine (CLARITIN REDITABS) 10 MG dissolvable tablet, Take by mouth., Disp: , Rfl:  .  mometasone (ELOCON) 0.1 % cream, Apply 1 application topically daily., Disp: 45 g, Rfl: 0 .  montelukast (SINGULAIR) 10 MG tablet, TAKE 1 TABLET(10 MG) BY MOUTH AT BEDTIME, Disp: 90 tablet, Rfl: 1  Review of Systems  Constitutional: Negative.   Respiratory: Negative.   Cardiovascular: Negative.   Gastrointestinal: Negative.   Neurological: Negative.   Psychiatric/Behavioral: Positive for agitation and decreased concentration. Negative for dysphoric mood, self-injury, sleep disturbance and suicidal ideas. The patient is nervous/anxious and is hyperactive.     Social History    Tobacco Use  . Smoking status: Never Smoker  . Smokeless tobacco: Never Used  Substance Use Topics  . Alcohol use: Yes    Comment: occasionally   Objective:   BP 118/70 (BP Location: Left Arm, Patient Position: Sitting, Cuff Size: Normal)   Pulse 100   Temp 98.2 F (36.8 C) (Oral)   Resp 16   Ht 5\' 10"  (1.778 m)   Wt 199 lb (90.3 kg)   BMI 28.55 kg/m    Physical Exam  Constitutional: He appears well-developed and well-nourished. No distress.  HENT:  Head: Normocephalic and atraumatic.  Neck: Normal range of motion. Neck supple.  Cardiovascular: Normal rate, regular rhythm and normal heart sounds. Exam reveals no gallop and no friction rub.  No murmur heard. Pulmonary/Chest: Effort normal and breath sounds normal. No respiratory distress. He has no wheezes. He has no rales.  Skin: He is not diaphoretic.  Psychiatric: He has a normal mood and affect. His speech is normal and behavior is normal. Judgment and thought content normal. Cognition and memory are normal.  Vitals reviewed.      Assessment & Plan:     1. Attention deficit hyperactivity disorder (ADHD), combined type Will start Vyvanse as below. I will see him back in 4 weeks. He is to call if he has any adverse effects.  - lisdexamfetamine (VYVANSE) 40 MG capsule; Take 1 capsule (40 mg total) by mouth every morning.  Dispense: 30 capsule; Refill: 0  Mar Daring, PA-C  [facility]

## 2017-01-23 ENCOUNTER — Other Ambulatory Visit: Payer: Self-pay | Admitting: Physician Assistant

## 2017-01-23 DIAGNOSIS — J4540 Moderate persistent asthma, uncomplicated: Secondary | ICD-10-CM

## 2017-01-29 ENCOUNTER — Encounter: Payer: Self-pay | Admitting: Physician Assistant

## 2017-01-30 ENCOUNTER — Ambulatory Visit: Payer: Self-pay | Admitting: Physician Assistant

## 2017-02-20 ENCOUNTER — Other Ambulatory Visit: Payer: Self-pay | Admitting: Physician Assistant

## 2017-02-20 DIAGNOSIS — J301 Allergic rhinitis due to pollen: Secondary | ICD-10-CM

## 2017-02-25 ENCOUNTER — Other Ambulatory Visit: Payer: Self-pay | Admitting: Physician Assistant

## 2017-02-25 ENCOUNTER — Encounter: Payer: Self-pay | Admitting: Physician Assistant

## 2017-02-25 DIAGNOSIS — F902 Attention-deficit hyperactivity disorder, combined type: Secondary | ICD-10-CM

## 2017-02-25 MED ORDER — LISDEXAMFETAMINE DIMESYLATE 40 MG PO CAPS: 40.0000 mg | ORAL_CAPSULE | ORAL | 0 refills | Status: DC

## 2017-03-15 ENCOUNTER — Encounter: Payer: Self-pay | Admitting: Physician Assistant

## 2017-03-15 DIAGNOSIS — F902 Attention-deficit hyperactivity disorder, combined type: Secondary | ICD-10-CM

## 2017-03-15 MED ORDER — LISDEXAMFETAMINE DIMESYLATE 50 MG PO CAPS: 50.0000 mg | ORAL_CAPSULE | Freq: Every day | ORAL | 0 refills | Status: DC

## 2017-03-15 NOTE — Addendum Note (Signed)
Addended by: Margaretann LovelessBURNETTE, Jamire Shabazz M on: 03/15/2017 05:17 PM   Modules accepted: Orders

## 2017-04-22 ENCOUNTER — Other Ambulatory Visit: Payer: Self-pay | Admitting: Physician Assistant

## 2017-04-22 DIAGNOSIS — F902 Attention-deficit hyperactivity disorder, combined type: Secondary | ICD-10-CM

## 2017-04-23 MED ORDER — LISDEXAMFETAMINE DIMESYLATE 50 MG PO CAPS: 50.0000 mg | ORAL_CAPSULE | Freq: Every day | ORAL | 0 refills | Status: DC

## 2017-05-29 ENCOUNTER — Other Ambulatory Visit: Payer: Self-pay | Admitting: Physician Assistant

## 2017-05-29 DIAGNOSIS — F902 Attention-deficit hyperactivity disorder, combined type: Secondary | ICD-10-CM

## 2017-05-29 MED ORDER — LISDEXAMFETAMINE DIMESYLATE 50 MG PO CAPS: 50.0000 mg | ORAL_CAPSULE | Freq: Every day | ORAL | 0 refills | Status: DC

## 2017-05-29 NOTE — Telephone Encounter (Signed)
NCCSR reviewed. [facility]

## 2017-06-26 ENCOUNTER — Other Ambulatory Visit: Payer: Self-pay | Admitting: Physician Assistant

## 2017-06-26 DIAGNOSIS — F902 Attention-deficit hyperactivity disorder, combined type: Secondary | ICD-10-CM

## 2017-06-27 MED ORDER — LISDEXAMFETAMINE DIMESYLATE 50 MG PO CAPS: 50.0000 mg | ORAL_CAPSULE | Freq: Every day | ORAL | 0 refills | Status: DC

## 2017-07-25 ENCOUNTER — Other Ambulatory Visit: Payer: Self-pay | Admitting: Physician Assistant

## 2017-07-25 DIAGNOSIS — F902 Attention-deficit hyperactivity disorder, combined type: Secondary | ICD-10-CM

## 2017-07-26 MED ORDER — LISDEXAMFETAMINE DIMESYLATE 50 MG PO CAPS: 50.0000 mg | ORAL_CAPSULE | Freq: Every day | ORAL | 0 refills | Status: DC

## 2017-07-26 NOTE — Telephone Encounter (Signed)
NCCSR reviewed. 

## 2017-08-27 ENCOUNTER — Other Ambulatory Visit: Payer: Self-pay | Admitting: Physician Assistant

## 2017-08-27 DIAGNOSIS — J301 Allergic rhinitis due to pollen: Secondary | ICD-10-CM

## 2017-08-27 DIAGNOSIS — F902 Attention-deficit hyperactivity disorder, combined type: Secondary | ICD-10-CM

## 2017-08-27 MED ORDER — LISDEXAMFETAMINE DIMESYLATE 50 MG PO CAPS: 50.0000 mg | ORAL_CAPSULE | Freq: Every day | ORAL | 0 refills | Status: DC

## 2017-09-13 ENCOUNTER — Encounter: Payer: Self-pay | Admitting: Physician Assistant

## 2017-09-13 NOTE — Telephone Encounter (Signed)
Vincent Melendez reports that she scheduled patient for tomorrow. Spoke with patient reports that he has been feeling this way since Wednesday. He reports that today it started since lunch time and he drove all the way from [place]. Reports that he ate lunch and he is feeling better. Per Antony ContrasJenni patient can keep the appointment for tomorrow but if he starts worsening later or at night to go to the ED.  Thanks,  -Joseilne

## 2017-09-14 ENCOUNTER — Ambulatory Visit: Payer: BC Managed Care – PPO | Admitting: Family Medicine

## 2017-09-14 VITALS — BP 94/66 | HR 87 | Temp 97.6°F | Resp 16 | Wt 181.0 lb

## 2017-09-14 DIAGNOSIS — R5383 Other fatigue: Secondary | ICD-10-CM | POA: Diagnosis not present

## 2017-09-14 DIAGNOSIS — T675XXA Heat exhaustion, unspecified, initial encounter: Secondary | ICD-10-CM | POA: Diagnosis not present

## 2017-09-14 LAB — POCT GLYCOSYLATED HEMOGLOBIN (HGB A1C): Hemoglobin A1C: 5.5 % (ref 4.0–5.6)

## 2017-09-14 LAB — GLUCOSE, POCT (MANUAL RESULT ENTRY): POC Glucose: 105 mg/dl — AB (ref 70–99)

## 2017-09-14 NOTE — Patient Instructions (Signed)
Discussed starting Gatorade and staying out of the heat over the weekend. Follow up with Tinnie GensJennie next week if not improved. Consider Miralax for constipation.

## 2017-09-14 NOTE — Progress Notes (Signed)
  Subjective:     Patient ID: Vincent Melendez, male   DOB: 04/26/1975, 42 y.o.   MRN: [id number] Chief Complaint  Patient presents with  . Fatigue  . Headache   HPI States onset on 7/24 after he mowed his lawn and drank more coffee than usual. 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 Reports 10 hour days going in and out of the truck. Also states he has not moved his bowels in a week which is not unusual for him. Prior hx of hyperglycemia but has lost weight and has been careful with his diet. He has held Vyvanse today. Review of Systems     Objective:   Physical Exam  Constitutional: He appears well-developed and well-nourished. No distress.  Cardiovascular: Normal rate and regular rhythm.  Pulmonary/Chest: Breath sounds normal. He has no wheezes.  Musculoskeletal: He exhibits no edema (of lower extremities).  Psychiatric:  Mildly anxious       Assessment:    1. Fatigue, unspecified type - POCT glycosylated hemoglobin (Hb A1C) - POCT glucose (manual entry)  2. Heat exhaustion, initial encounter    Plan:    Start Gatorade and stay out of the heat today. Discussed use of Miralax. F/u with his primary caregiver next week if not feeling better.

## 2017-11-12 ENCOUNTER — Other Ambulatory Visit: Payer: Self-pay | Admitting: Physician Assistant

## 2017-11-12 DIAGNOSIS — F902 Attention-deficit hyperactivity disorder, combined type: Secondary | ICD-10-CM

## 2017-11-12 MED ORDER — LISDEXAMFETAMINE DIMESYLATE 50 MG PO CAPS: 50.0000 mg | ORAL_CAPSULE | Freq: Every day | ORAL | 0 refills | Status: DC

## 2017-11-28 ENCOUNTER — Other Ambulatory Visit: Payer: Self-pay | Admitting: Physician Assistant

## 2017-11-28 DIAGNOSIS — F902 Attention-deficit hyperactivity disorder, combined type: Secondary | ICD-10-CM

## 2017-11-29 MED ORDER — LISDEXAMFETAMINE DIMESYLATE 50 MG PO CAPS: 50.0000 mg | ORAL_CAPSULE | Freq: Every day | ORAL | 0 refills | Status: DC

## 2017-12-16 ENCOUNTER — Other Ambulatory Visit: Payer: Self-pay | Admitting: Physician Assistant

## 2017-12-16 DIAGNOSIS — F902 Attention-deficit hyperactivity disorder, combined type: Secondary | ICD-10-CM

## 2017-12-17 MED ORDER — LISDEXAMFETAMINE DIMESYLATE 50 MG PO CAPS: 50.0000 mg | ORAL_CAPSULE | Freq: Every day | ORAL | 0 refills | Status: DC

## 2018-01-24 ENCOUNTER — Other Ambulatory Visit: Payer: Self-pay | Admitting: Physician Assistant

## 2018-01-24 DIAGNOSIS — J4540 Moderate persistent asthma, uncomplicated: Secondary | ICD-10-CM

## 2018-03-28 ENCOUNTER — Other Ambulatory Visit: Payer: Self-pay | Admitting: Physician Assistant

## 2018-03-28 DIAGNOSIS — J301 Allergic rhinitis due to pollen: Secondary | ICD-10-CM

## 2018-03-29 ENCOUNTER — Other Ambulatory Visit: Payer: Self-pay | Admitting: Family Medicine

## 2018-03-29 DIAGNOSIS — F902 Attention-deficit hyperactivity disorder, combined type: Secondary | ICD-10-CM

## 2018-03-31 MED ORDER — LISDEXAMFETAMINE DIMESYLATE 50 MG PO CAPS: 50.0000 mg | ORAL_CAPSULE | Freq: Every day | ORAL | 0 refills | Status: DC

## 2018-05-27 ENCOUNTER — Other Ambulatory Visit: Payer: Self-pay | Admitting: Physician Assistant

## 2018-05-27 DIAGNOSIS — J4540 Moderate persistent asthma, uncomplicated: Secondary | ICD-10-CM

## 2018-09-22 ENCOUNTER — Other Ambulatory Visit: Payer: Self-pay | Admitting: Physician Assistant

## 2018-09-22 DIAGNOSIS — J4540 Moderate persistent asthma, uncomplicated: Secondary | ICD-10-CM

## 2018-09-22 NOTE — Telephone Encounter (Signed)
LOV 09/14/2017

## 2019-02-11 ENCOUNTER — Other Ambulatory Visit: Payer: Self-pay | Admitting: Physician Assistant

## 2019-02-11 DIAGNOSIS — J301 Allergic rhinitis due to pollen: Secondary | ICD-10-CM

## 2019-02-11 NOTE — Telephone Encounter (Signed)
Requested medication (s) are due for refill today: yes  Requested medication (s) are on the active medication list: yes  Last refill:  12/22/2018  Future visit scheduled: no  Notes to clinic:  no valid encounter within last 12 months   Requested Prescriptions  Pending Prescriptions Disp Refills   montelukast (SINGULAIR) 10 MG tablet [Pharmacy Med Name: MONTELUKAST 10MG  TABLETS] 90 tablet 2    Sig: TAKE 1 TABLET(10 MG) BY MOUTH AT BEDTIME      Pulmonology:  Leukotriene Inhibitors Failed - 02/11/2019  4:57 AM      Failed - Valid encounter within last 12 months    Recent Outpatient Visits           1 year ago Fatigue, unspecified type   [place], [place]   2 years ago Attention deficit hyperactivity disorder (ADHD), combined type   [place], [place]   2 years ago Elevated LFTs   [place], [place], [place]   2 years ago Hyperglycemia   [place], [place], [place]   3 years ago Hypoglycemia   [place], [place], [place]

## 2019-02-11 NOTE — Telephone Encounter (Signed)
Patient has not been seen since 08/2017

## 2019-04-24 ENCOUNTER — Ambulatory Visit
Admission: RE | Admit: 2019-04-24 | Discharge: 2019-04-24 | Disposition: A | Discharge status: Home / Self Care | Payer: BC Managed Care – PPO | Source: Ambulatory Visit | Attending: Physician Assistant | Admitting: Physician Assistant

## 2019-04-24 ENCOUNTER — Ambulatory Visit: Payer: BC Managed Care – PPO | Admitting: Physician Assistant

## 2019-04-24 ENCOUNTER — Encounter: Payer: Self-pay | Admitting: Physician Assistant

## 2019-04-24 ENCOUNTER — Other Ambulatory Visit: Payer: Self-pay

## 2019-04-24 VITALS — BP 119/80 | HR 80 | Temp 97.3°F | Wt 204.0 lb

## 2019-04-24 DIAGNOSIS — M5442 Lumbago with sciatica, left side: Secondary | ICD-10-CM

## 2019-04-24 MED ORDER — BACLOFEN 10 MG PO TABS: 10.0000 mg | ORAL_TABLET | Freq: Three times a day (TID) | ORAL | 0 refills | Status: DC

## 2019-04-24 MED ORDER — METHYLPREDNISOLONE 4 MG PO TBPK: ORAL_TABLET | ORAL | 0 refills | Status: DC

## 2019-04-24 NOTE — Patient Instructions (Signed)

## 2019-04-24 NOTE — Progress Notes (Signed)
Patient: Vincent Melendez Male    DOB: 12-14-1975   44 y.o.   MRN: [id number] Visit Date: 04/24/2019  Today's Provider: Margaretann Loveless, PA-C   Chief Complaint  Patient presents with  . Back Pain    Lower back pain; left leg and foot numbness.   Subjective:     Back Pain This is a chronic problem. The problem has been gradually worsening since onset. The pain is present in the lumbar spine. Radiates to: Left leg. The symptoms are aggravated by position, standing and sitting. Associated symptoms include leg pain, numbness, paresis, tingling and weakness. Pertinent negatives include no bladder incontinence, bowel incontinence or dysuria. He has tried chiropractic manipulation for the symptoms. The treatment provided no relief.  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 Pain started in the back initially. About 4 weeks ago he started seeing a chiropractor, Dr. Dorise Bullion. He started having some improvements in the back pain. Patient then started doing stretches on a Chirp wheel. One night about 2 weeks ago while doing stretches he felt intense pain that started radiating down the left leg. He went back to the chiropractor and was told he had left side sciatica with an absent left achilles reflex, left peroneal muscle weakness and had a positive heel walk. He has been taking it easy and reports he drank a couple shots of liquor last night and was able to rest without pain. He reports today it isn't bothering him as much as it had been. He has tried IBU 800mg  with mild relief, temporary. Has tried one of his brother's muscle relaxers, can't remember the name, no relief.  Allergies  Allergen Reactions  . Codeine Itching     Current Outpatient Medications:  .  albuterol (VENTOLIN HFA) 108  (90 BASE) MCG/ACT inhaler, Inhale into the lungs., Disp: , Rfl:  .  fluticasone (FLONASE ALLERGY RELIEF) 50 MCG/ACT nasal spray, Place into the nose. Reported on 04/04/2015, Disp: , Rfl:  .  ibuprofen (ADVIL,MOTRIN) 800 MG tablet, Take 1 tablet (800 mg total) by mouth 3 (three) times daily., Disp: 30 tablet, Rfl: 0 .  loratadine (CLARITIN REDITABS) 10 MG dissolvable tablet, Take by mouth., Disp: , Rfl:  .  mometasone (ELOCON) 0.1 % cream, Apply 1 application topically daily., Disp: 45 g, Rfl: 0 .  montelukast (SINGULAIR) 10 MG tablet, Take 1 tablet (10 mg total) by mouth at bedtime. Please schedule office visit before any future refills, Disp: 30 tablet, Rfl: 0 .  WIXELA INHUB 500-50 MCG/DOSE AEPB, INHALE 1 PUFF BY MOUTH TWICE DAILY, Disp: 60 each, Rfl: 5 .  Brimonidine Tartrate 0.33 % GEL, Apply a small amount to affected area once daily prn, Disp: 30 g, Rfl: 5 .  lisdexamfetamine (VYVANSE) 50 MG capsule, Take 1 capsule (50 mg total) by mouth daily. (Patient not taking: Reported on 04/24/2019), Disp: 30 capsule, Rfl: 0  Review of Systems  Constitutional: Negative.   Respiratory: Negative.   Cardiovascular: Negative.   Gastrointestinal: Negative.  Negative for bowel incontinence.  Genitourinary: Negative for bladder incontinence and dysuria.  Musculoskeletal: Positive for back pain, gait problem and myalgias. Negative for arthralgias, joint swelling, neck pain and neck stiffness.  Neurological: Positive for tingling, weakness and numbness.  Social History   Tobacco Use  . Smoking status: Never Smoker  . Smokeless tobacco: Never Used  Substance Use Topics  . Alcohol use: Yes    Comment: occasionally      Objective:   BP 119/80 (BP Location: Left Arm, Patient Position: Sitting, Cuff Size: Large)   Pulse 80   Temp (!) 97.3 F (36.3 C) (Temporal)   Wt 204 lb (92.5 kg)   BMI 29.27 kg/m  Vitals:   04/24/19 1535  BP: 119/80  Pulse: 80  Temp: (!) 97.3 F (36.3 C)  TempSrc:  Temporal  Weight: 204 lb (92.5 kg)  Body mass index is 29.27 kg/m.   Physical Exam Vitals reviewed.  Constitutional:      General: He is not in acute distress.    Appearance: Normal appearance. He is well-developed. He is not ill-appearing.  HENT:     Head: Normocephalic and atraumatic.  Eyes:     General: No scleral icterus.    Conjunctiva/sclera: Conjunctivae normal.  Pulmonary:     Effort: Pulmonary effort is normal. No respiratory distress.  Musculoskeletal:     Cervical back: Normal range of motion and neck supple.  Neurological:     Mental Status: He is alert.  Psychiatric:        Mood and Affect: Mood normal.        Behavior: Behavior normal.        Thought Content: Thought content normal.        Judgment: Judgment normal.      No results found for any visits on 04/24/19.     Assessment & Plan    1. Acute midline low back pain with left-sided sciatica Low back pain with left side sciatica, slightly improved currently. Still some numbness down left leg. Had absent achilles reflex, left peroneal muscle weakness and a positive heel walk. Will get imaging as below. Will try medrol dose pak and baclofen. Back exercises printed for patient. Will refer to ortho spine as below. Call if symptoms acutely worsen in the meantime.  - DG Lumbar Spine Complete; Future - methylPREDNISolone (MEDROL) 4 MG TBPK tablet; 6 day taper; take as directed on package instructions  Dispense: 21 tablet; Refill: 0 - baclofen (LIORESAL) 10 MG tablet; Take 1 tablet (10 mg total) by mouth 3 (three) times daily.  Dispense: 30 each; Refill: 0 - Ambulatory referral to [place], PA-C  [facility]

## 2019-04-26 ENCOUNTER — Encounter: Payer: Self-pay | Admitting: Physician Assistant

## 2019-04-27 ENCOUNTER — Telehealth: Payer: Self-pay

## 2019-04-27 NOTE — Telephone Encounter (Signed)
There is arthritic changes noted of the lumbar spine (low back) but no bony abnormalities noted.  Written by Margaretann Loveless, PA-C on 04/27/2019 7:26 AM EST Seen by patient Vincent Melendez on 04/27/2019 4:13 PM EST

## 2019-04-27 NOTE — Telephone Encounter (Signed)
-----   Message from Margaretann Loveless, [place] sent at 04/27/2019  7:26 AM EST ----- There is arthritic changes noted of the lumbar spine (low back) but no bony abnormalities noted.

## 2019-05-13 ENCOUNTER — Other Ambulatory Visit: Payer: Self-pay | Admitting: Physician Assistant

## 2019-05-13 DIAGNOSIS — J301 Allergic rhinitis due to pollen: Secondary | ICD-10-CM

## 2019-05-13 NOTE — Telephone Encounter (Signed)
Requested  medications are  due for refill today yes  Requested medications are on the active medication list yes  Last refill 2/21  Future visit scheduled no  Notes to clinic had appt recently but only addressed back pain.

## 2019-06-02 ENCOUNTER — Encounter: Payer: Self-pay | Admitting: Physician Assistant

## 2019-06-06 ENCOUNTER — Other Ambulatory Visit: Payer: Self-pay

## 2019-06-06 ENCOUNTER — Ambulatory Visit
Admission: EM | Admit: 2019-06-06 | Discharge: 2019-06-06 | Disposition: A | Discharge status: Home / Self Care | Payer: BC Managed Care – PPO | Attending: Family Medicine | Admitting: Family Medicine

## 2019-06-06 ENCOUNTER — Encounter: Payer: Self-pay | Admitting: Emergency Medicine

## 2019-06-06 DIAGNOSIS — R519 Headache, unspecified: Secondary | ICD-10-CM | POA: Diagnosis not present

## 2019-06-06 DIAGNOSIS — Z79899 Other long term (current) drug therapy: Secondary | ICD-10-CM | POA: Insufficient documentation

## 2019-06-06 DIAGNOSIS — Z20822 Contact with and (suspected) exposure to covid-19: Secondary | ICD-10-CM | POA: Insufficient documentation

## 2019-06-06 DIAGNOSIS — R5383 Other fatigue: Secondary | ICD-10-CM | POA: Diagnosis present

## 2019-06-06 DIAGNOSIS — Z885 Allergy status to narcotic agent status: Secondary | ICD-10-CM | POA: Diagnosis not present

## 2019-06-06 MED ORDER — BUTALBITAL-APAP-CAFFEINE 50-325-40 MG PO TABS: 1.0000 | ORAL_TABLET | Freq: Four times a day (QID) | ORAL | 0 refills | Status: AC | PRN

## 2019-06-06 NOTE — ED Provider Notes (Signed)
[facility]    CSN: [id number] Arrival date & time: 06/06/19  1345  History   Chief Complaint Fatigue, headache  HPI  44 year old male presents with the above complaints.  Patient reports that he has not been feeling well for the past week. He reports fatigue. He states that he woke up this morning and had severe headache. He states that it improved with Excedrin but has not resolved. Patient also reports that his tongue has felt funny. He states that he ate something and it did not taste as it normally does. However, he subsequently drank coffee and it tasted normal. No documented fever. He has had some cough particularly in the morning for the past few months. No documented fever. He does not endorse chills. He states that his wife felt that he was hot. He is currently afebrile. He is concerned that he may have COVID-19. No other complaints.  Past Medical History:  Diagnosis Date  . Asthma    Patient Active Problem List   Diagnosis Date Noted  . Acute stress disorder 09/23/2014  . Allergic rhinitis 09/23/2014  . Anxiety 09/23/2014  . Airway hyperreactivity 09/23/2014  . Acid reflux 09/23/2014  . Irregular cardiac rhythm 09/23/2014  . RAD (reactive airway disease) 09/23/2014  . Rosacea 09/23/2014  . Eczema 09/23/2014   Past Surgical History:  Procedure Laterality Date  . INNER EAR SURGERY Right   . VASECTOMY     Home Medications    Prior to Admission medications   Medication Sig Start Date End Date Taking? Authorizing Provider  fexofenadine (ALLEGRA) 180 MG tablet Take 180 mg by mouth daily.   Yes [provider]  montelukast (SINGULAIR) 10 MG tablet TAKE 1 TABLET BY MOUTH AT BEDTIME 05/14/19  Yes Margaretann Loveless, PA-C  WIXELA INHUB 500-50 MCG/DOSE AEPB INHALE 1 PUFF BY MOUTH TWICE DAILY 09/22/18  Yes Joycelyn Man M, PA-C  albuterol (VENTOLIN HFA) 108 (90 BASE) MCG/ACT inhaler Inhale into the lungs. 02/23/14 06/06/19 Yes [provider]    butalbital-acetaminophen-caffeine (FIORICET) 50-325-40 MG tablet Take 1 tablet by mouth every 6 (six) hours as needed for headache. 06/06/19 06/05/20  Tommie Sams, DO  fluticasone [facility] ALLERGY RELIEF) 50 MCG/ACT nasal spray Place into the nose. Reported on 04/04/2015  06/06/19  [provider]  loratadine (CLARITIN REDITABS) 10 MG dissolvable tablet Take by mouth.  06/06/19  [provider]    Family History Family History  Problem Relation Age of Onset  . Fibromyalgia Mother   . COPD Father   . Kidney disease Father   . Healthy Sister   . Healthy Sister   . Arthritis Sister   . Healthy Sister   . Bipolar disorder Sister   . Healthy Sister   . Asthma Brother   . Anxiety disorder Brother   . Healthy Brother   . Healthy Brother   . Healthy Brother    Social History Social History   Tobacco Use  . Smoking status: Never Smoker  . Smokeless tobacco: Never Used  Substance Use Topics  . Alcohol use: Yes    Comment: occasionally  . Drug use: No   Allergies   Codeine   Review of Systems Review of Systems  Constitutional: Positive for fatigue.  Respiratory: Positive for cough.   Neurological: Positive for headaches.   Physical Exam Triage Vital Signs ED Triage Vitals  Enc Vitals Group     BP      Pulse      Resp  Temp      Temp src      SpO2      Weight      Height      Head Circumference      Peak Flow      Pain Score      Pain Loc      Pain Edu?      Excl. in [place]?    Updated Vital Signs BP 126/87 (BP Location: Right Arm)   Pulse 63   Temp 98 F (36.7 C) (Oral)   Resp 14   Ht 5\' 9"  (1.753 m)   Wt 90.7 kg   SpO2 100%   BMI 29.53 kg/m   Visual Acuity Right Eye Distance:   Left Eye Distance:   Bilateral Distance:    Right Eye Near:   Left Eye Near:    Bilateral Near:     Physical Exam Vitals and nursing note reviewed.  Constitutional:      General: He is not in acute distress.    Appearance: Normal appearance. He is  not ill-appearing.  HENT:     Head: Normocephalic and atraumatic.  Eyes:     General:        Right eye: No discharge.        Left eye: No discharge.     Conjunctiva/sclera: Conjunctivae normal.  Cardiovascular:     Rate and Rhythm: Normal rate and regular rhythm.     Heart sounds: No murmur.  Pulmonary:     Effort: Pulmonary effort is normal.     Breath sounds: Normal breath sounds. No wheezing, rhonchi or rales.  Neurological:     Mental Status: He is alert.  Psychiatric:        Mood and Affect: Mood normal.        Behavior: Behavior normal.    UC Treatments / Results  Labs (all labs ordered are listed, but only abnormal results are displayed) Labs Reviewed  SARS CORONAVIRUS 2 (TAT 6-24 HRS)    EKG   Radiology No results found.  Procedures Procedures (including critical care time)  Medications Ordered in UC Medications - No data to display  Initial Impression / Assessment and Plan / UC Course  I have reviewed the triage vital signs and the nursing notes.  Pertinent labs & imaging results that were available during my care of the patient were reviewed by me and considered in my medical decision making (see chart for details).    44 year old male presents with fatigue and headache. Also reports cough. Awaiting Covid test results. Fioricet for headache. Supportive care.  Final Clinical Impressions(s) / UC Diagnoses   Final diagnoses:  Fatigue, unspecified type  Acute nonintractable headache, unspecified headache type     Discharge Instructions     Medication as directed.  COVID test should be back tomorrow.  Take care  Dr. Lacinda Axon     ED Prescriptions    Medication Sig Dispense Auth. Provider   butalbital-acetaminophen-caffeine (FIORICET) 50-325-40 MG tablet Take 1 tablet by mouth every 6 (six) hours as needed for headache. 20 tablet Coral Spikes, DO     PDMP not reviewed this encounter.   Coral Spikes, [place] 06/06/19 1527

## 2019-06-06 NOTE — ED Triage Notes (Signed)
Patient c/o HA that started this morning.  Patient c/o cough in the morning for the past couple of months.  Patient unsure if he has had a fever.

## 2019-06-06 NOTE — Discharge Instructions (Signed)
Medication as directed.  COVID test should be back tomorrow.  Take care  Dr. Adriana Simas

## 2019-06-07 LAB — SARS CORONAVIRUS 2 (TAT 6-24 HRS): SARS Coronavirus 2: NEGATIVE

## 2019-06-09 ENCOUNTER — Encounter: Payer: Self-pay | Admitting: Physician Assistant

## 2019-06-09 DIAGNOSIS — R42 Dizziness and giddiness: Secondary | ICD-10-CM

## 2019-06-09 DIAGNOSIS — R110 Nausea: Secondary | ICD-10-CM

## 2019-06-09 DIAGNOSIS — R5383 Other fatigue: Secondary | ICD-10-CM

## 2019-06-11 ENCOUNTER — Ambulatory Visit
Admission: EM | Admit: 2019-06-11 | Discharge: 2019-06-11 | Disposition: A | Discharge status: Home / Self Care | Payer: BC Managed Care – PPO | Attending: Emergency Medicine | Admitting: Emergency Medicine

## 2019-06-11 ENCOUNTER — Other Ambulatory Visit: Payer: Self-pay

## 2019-06-11 DIAGNOSIS — R519 Headache, unspecified: Secondary | ICD-10-CM | POA: Diagnosis not present

## 2019-06-11 MED ORDER — KETOROLAC TROMETHAMINE 30 MG/ML IJ SOLN
30.0000 mg | Freq: Once | INTRAMUSCULAR | Status: AC
  Administered 2019-06-11: 30 mg via INTRAMUSCULAR

## 2019-06-11 NOTE — Discharge Instructions (Signed)
Your were given an injection of Toradol for your headache.    Schedule an appointment with your primary care provider to follow-up if your symptoms persist.    Go to the emergency department if you have a severe headache that is not relieved with the medication your were prescribed previously at [facility].

## 2019-06-11 NOTE — ED Provider Notes (Signed)
Vincent Melendez    CSN: [id number] Arrival date & time: 06/11/19  0820      History   Chief Complaint Chief Complaint  Patient presents with  . Migraine    HPI Vincent Melendez is a 44 y.o. male.   Patient presents with a headache which began this morning when he woke up.  He indicates the pain is located across his forehead.  He took Fioricet and drink coffee and reports improvement of his headache.  He denies other symptoms, including dizziness, focal weakness, facial asymmetry, numbness, fever, chills, earache, congestion, sore throat, cough, shortness of breath, chest pain, vomiting, diarrhea, rash, or other symptoms.  Seen at [facility] urgent care on 06/06/2019; had negative COVID test; headache treated with Fioricet.    The history is provided by the patient.    Past Medical History:  Diagnosis Date  . Asthma     Patient Active Problem List   Diagnosis Date Noted  . Acute stress disorder 09/23/2014  . Allergic rhinitis 09/23/2014  . Anxiety 09/23/2014  . Airway hyperreactivity 09/23/2014  . Acid reflux 09/23/2014  . Irregular cardiac rhythm 09/23/2014  . RAD (reactive airway disease) 09/23/2014  . Rosacea 09/23/2014  . Eczema 09/23/2014    Past Surgical History:  Procedure Laterality Date  . INNER EAR SURGERY Right   . VASECTOMY         Home Medications    Prior to Admission medications   Medication Sig Start Date End Date Taking? Authorizing Provider  butalbital-acetaminophen-caffeine (FIORICET) 50-325-40 MG tablet Take 1 tablet by mouth every 6 (six) hours as needed for headache. 06/06/19 06/05/20 Yes Cook, Jayce G, DO  fexofenadine (ALLEGRA) 180 MG tablet Take 180 mg by mouth daily.   Yes [provider]  montelukast (SINGULAIR) 10 MG tablet TAKE 1 TABLET BY MOUTH AT BEDTIME 05/14/19  Yes Mar Daring, PA-C  WIXELA INHUB 500-50 MCG/DOSE AEPB INHALE 1 PUFF BY MOUTH TWICE DAILY 09/22/18  Yes Fenton Malling M, PA-C  albuterol  (VENTOLIN HFA) 108 (90 BASE) MCG/ACT inhaler Inhale into the lungs. 02/23/14 06/06/19  [provider]  fluticasone Asencion Islam ALLERGY RELIEF) 50 MCG/ACT nasal spray Place into the nose. Reported on 04/04/2015  06/06/19  [provider]  loratadine (CLARITIN REDITABS) 10 MG dissolvable tablet Take by mouth.  06/06/19  [provider]    Family History Family History  Problem Relation Age of Onset  . Fibromyalgia Mother   . COPD Father   . Kidney disease Father   . Healthy Sister   . Healthy Sister   . Arthritis Sister   . Healthy Sister   . Bipolar disorder Sister   . Healthy Sister   . Asthma Brother   . Anxiety disorder Brother   . Healthy Brother   . Healthy Brother   . Healthy Brother     Social History Social History   Tobacco Use  . Smoking status: Never Smoker  . Smokeless tobacco: Never Used  Substance Use Topics  . Alcohol use: Yes    Comment: occasionally  . Drug use: No     Allergies   Codeine   Review of Systems Review of Systems  Constitutional: Negative for chills and fever.  HENT: Negative for congestion, ear pain, rhinorrhea and sore throat.   Eyes: Negative for pain and visual disturbance.  Respiratory: Negative for cough and shortness of breath.   Cardiovascular: Negative for chest pain and palpitations.  Gastrointestinal: Negative for abdominal pain, diarrhea, nausea and  vomiting.  Genitourinary: Negative for dysuria and hematuria.  Musculoskeletal: Negative for arthralgias and back pain.  Skin: Negative for color change and rash.  Neurological: Positive for headaches. Negative for dizziness, tremors, seizures, syncope, facial asymmetry, speech difficulty, weakness, light-headedness and numbness.  All other systems reviewed and are negative.    Physical Exam Triage Vital Signs ED Triage Vitals  Enc Vitals Group     BP 06/11/19 0825 119/81     Pulse Rate 06/11/19 0825 73     Resp 06/11/19 0825 18     Temp 06/11/19  0825 97.9 F (36.6 C)     Temp Source 06/11/19 0825 Oral     SpO2 06/11/19 0825 97 %     Weight 06/11/19 0823 200 lb (90.7 kg)     Height 06/11/19 0823 5\' 9"  (1.753 m)     Head Circumference --      Peak Flow --      Pain Score 06/11/19 0823 8     Pain Loc --      Pain Edu? --      Excl. in [place]? --    No data found.  Updated Vital Signs BP 119/81 (BP Location: Left Arm)   Pulse 73   Temp 97.9 F (36.6 C) (Oral)   Resp 18   Ht 5\' 9"  (1.753 m)   Wt 200 lb (90.7 kg)   SpO2 97%   BMI 29.53 kg/m   Visual Acuity Right Eye Distance:   Left Eye Distance:   Bilateral Distance:    Right Eye Near:   Left Eye Near:    Bilateral Near:     Physical Exam Vitals and nursing note reviewed.  Constitutional:      General: He is not in acute distress.    Appearance: He is well-developed. He is not ill-appearing.  HENT:     Head: Normocephalic and atraumatic.     Right Ear: Tympanic membrane normal.     Left Ear: Tympanic membrane normal.     Nose: Nose normal.     Mouth/Throat:     Mouth: Mucous membranes are moist.     Pharynx: Oropharynx is clear.  Eyes:     Extraocular Movements: Extraocular movements intact.     Conjunctiva/sclera: Conjunctivae normal.     Pupils: Pupils are equal, round, and reactive to light.  Cardiovascular:     Rate and Rhythm: Normal rate and regular rhythm.     Heart sounds: No murmur.  Pulmonary:     Effort: Pulmonary effort is normal. No respiratory distress.     Breath sounds: Normal breath sounds.  Abdominal:     General: Bowel sounds are normal.     Palpations: Abdomen is soft.     Tenderness: There is no abdominal tenderness. There is no guarding or rebound.  Musculoskeletal:     Cervical back: Neck supple.  Skin:    General: Skin is warm and dry.     Findings: No rash.  Neurological:     General: No focal deficit present.     Mental Status: He is alert and oriented to person, place, and time.     Cranial Nerves: No cranial nerve  deficit.     Sensory: No sensory deficit.     Motor: No weakness.     Coordination: Coordination normal.     Gait: Gait normal.  Psychiatric:        Mood and Affect: Mood normal.        Behavior:  Behavior normal.      UC Treatments / Results  Labs (all labs ordered are listed, but only abnormal results are displayed) Labs Reviewed - No data to display  EKG   Radiology No results found.  Procedures Procedures (including critical care time)  Medications Ordered in UC Medications  ketorolac (TORADOL) 30 MG/ML injection 30 mg (30 mg Intramuscular Given 06/11/19 0843)    Initial Impression / Assessment and Plan / UC Course  I have reviewed the triage vital signs and the nursing notes.  Pertinent labs & imaging results that were available during my care of the patient were reviewed by me and considered in my medical decision making (see chart for details).   Acute non-intractable headache.  Patient is well-appearing; exam is reassuring.  Treated with an injection of Toradol.  Instructed patient to schedule an appointment with his PCP if his symptoms persist.  Instructed him to go to the ED if he has severe headache that is not relieved with medication.  Patient agrees to plan of care.   Final Clinical Impressions(s) / UC Diagnoses   Final diagnoses:  Acute nonintractable headache, unspecified headache type     Discharge Instructions     Your were given an injection of Toradol for your headache.    Schedule an appointment with your primary care provider to follow-up if your symptoms persist.    Go to the emergency department if you have a severe headache that is not relieved with the medication your were prescribed previously at [facility].          ED Prescriptions    None     I have reviewed the PDMP during this encounter.   Mickie Bail, NP 06/11/19 [phone]

## 2019-06-11 NOTE — ED Triage Notes (Signed)
Patient states that he woke up around 2am with a migraine. Patient was seen by [facility] on Saturday and Covid tested and given Fioricet. States that he took a Fioricet this morning and had some coffee and has not experienced any relief. Patient states that he feels like his headache is worse with movement.

## 2019-06-12 ENCOUNTER — Other Ambulatory Visit: Payer: Self-pay | Admitting: Physician Assistant

## 2019-06-12 DIAGNOSIS — J301 Allergic rhinitis due to pollen: Secondary | ICD-10-CM

## 2019-06-12 NOTE — Telephone Encounter (Signed)
Requested  medications are  due for refill today yes  Requested medications are on the active medication list yes  Last refill 3/25  Future visit scheduled no  Notes to clinic question if has already had a curtesy refill? Had visit concerning just back pain.

## 2019-06-13 LAB — LIPID PANEL WITH LDL/HDL RATIO
Cholesterol, Total: 133 mg/dL (ref 100–199)
HDL: 37 mg/dL — ABNORMAL LOW (ref >39)
LDL Chol Calc (NIH): 79 mg/dL (ref 0–99)
LDL/HDL Ratio: 2.1 ratio (ref 0.0–3.6)
Triglycerides: 90 mg/dL (ref 0–149)
VLDL Cholesterol Cal: 17 mg/dL (ref 5–40)

## 2019-06-13 LAB — CBC WITH DIFFERENTIAL/PLATELET
Basophils Absolute: 0 10*3/uL (ref 0.0–0.2)
Basos: 0 %
EOS (ABSOLUTE): 0.1 10*3/uL (ref 0.0–0.4)
Eos: 1 %
Hematocrit: 40 % (ref 37.5–51.0)
Hemoglobin: 13.9 g/dL (ref 13.0–17.7)
Immature Grans (Abs): 0 10*3/uL (ref 0.0–0.1)
Immature Granulocytes: 0 %
Lymphocytes Absolute: 1 10*3/uL (ref 0.7–3.1)
Lymphs: 13 %
MCH: 31 pg (ref 26.6–33.0)
MCHC: 34.8 g/dL (ref 31.5–35.7)
MCV: 89 fL (ref 79–97)
Monocytes Absolute: 0.5 10*3/uL (ref 0.1–0.9)
Monocytes: 6 %
Neutrophils Absolute: 6.2 10*3/uL (ref 1.4–7.0)
Neutrophils: 80 %
Platelets: 227 10*3/uL (ref 150–450)
RBC: 4.49 x10E6/uL (ref 4.14–5.80)
RDW: 12.4 % (ref 11.6–15.4)
WBC: 7.9 10*3/uL (ref 3.4–10.8)

## 2019-06-13 LAB — COMPREHENSIVE METABOLIC PANEL
ALT: 32 IU/L (ref 0–44)
AST: 25 IU/L (ref 0–40)
Albumin/Globulin Ratio: 2.6 — ABNORMAL HIGH (ref 1.2–2.2)
Albumin: 4.5 g/dL (ref 4.0–5.0)
Alkaline Phosphatase: 52 IU/L (ref 39–117)
BUN/Creatinine Ratio: 24 — ABNORMAL HIGH (ref 9–20)
BUN: 21 mg/dL (ref 6–24)
Bilirubin Total: 0.7 mg/dL (ref 0.0–1.2)
CO2: 22 mmol/L (ref 20–29)
Calcium: 8.9 mg/dL (ref 8.7–10.2)
Chloride: 100 mmol/L (ref 96–106)
Creatinine, Ser: 0.89 mg/dL (ref 0.76–1.27)
GFR calc Af Amer: 120 mL/min/{1.73_m2} (ref >59)
GFR calc non Af Amer: 104 mL/min/{1.73_m2} (ref >59)
Globulin, Total: 1.7 g/dL (ref 1.5–4.5)
Glucose: 88 mg/dL (ref 65–99)
Potassium: 4.2 mmol/L (ref 3.5–5.2)
Sodium: 141 mmol/L (ref 134–144)
Total Protein: 6.2 g/dL (ref 6.0–8.5)

## 2019-06-13 LAB — T4 AND TSH
T4, Total: 4.6 ug/dL (ref 4.5–12.0)
TSH: 1.11 u[IU]/mL (ref 0.450–4.500)

## 2019-06-13 LAB — HEMOGLOBIN A1C
Est. average glucose Bld gHb Est-mCnc: 117 mg/dL
Hgb A1c MFr Bld: 5.7 % — ABNORMAL HIGH (ref 4.8–5.6)

## 2019-06-15 ENCOUNTER — Telehealth: Payer: Self-pay

## 2019-06-15 NOTE — Telephone Encounter (Signed)
[employer]   Result Notes and Comments to Patient Comment seen by patient Vincent Melendez on 06/15/2019 1:06 PM EDT

## 2019-06-15 NOTE — Telephone Encounter (Signed)
-----   Message from Margaretann Loveless, [place] sent at 06/15/2019  1:05 PM EDT ----- Blood count is normal. Kidney and liver function are normal. Sodium, potassium and calcium are normal. Thyroid is normal. Cholesterol is normal and improved. A1c is slightly increased to 5.7 compared to 5.5 last year, however, fasting glucose was normal.

## 2019-07-11 ENCOUNTER — Other Ambulatory Visit: Payer: Self-pay | Admitting: Physician Assistant

## 2019-07-11 DIAGNOSIS — J301 Allergic rhinitis due to pollen: Secondary | ICD-10-CM

## 2019-07-11 NOTE — Telephone Encounter (Signed)
Requested Prescriptions  Pending Prescriptions Disp Refills  . montelukast (SINGULAIR) 10 MG tablet [Pharmacy Med Name: MONTELUKAST 10MG  TABLETS] 90 tablet 3    Sig: TAKE 1 TABLET BY MOUTH AT BEDTIME     Pulmonology:  Leukotriene Inhibitors Passed - 07/11/2019 10:44 AM      Passed - Valid encounter within last 12 months    Recent Outpatient Visits          2 months ago Acute midline low back pain with left-sided sciatica   [facility] [place], [place], PA-C   1 year ago Fatigue, unspecified type   [facility] [place], [place], [place]   2 years ago Attention deficit hyperactivity disorder (ADHD), combined type   [facility], [place], [place]   2 years ago Elevated LFTs   [facility] [place], [place], [place]   2 years ago Hyperglycemia   [facility] [place], [place], [place]

## 2019-08-14 ENCOUNTER — Telehealth: Payer: Self-pay | Admitting: Physician Assistant

## 2019-08-14 ENCOUNTER — Other Ambulatory Visit: Payer: Self-pay | Admitting: Physician Assistant

## 2019-08-14 DIAGNOSIS — J4540 Moderate persistent asthma, uncomplicated: Secondary | ICD-10-CM

## 2019-08-14 MED ORDER — FLUTICASONE-SALMETEROL 500-50 MCG/DOSE IN AEPB: 1.0000 | INHALATION_SPRAY | Freq: Two times a day (BID) | RESPIRATORY_TRACT | 2 refills | Status: DC

## 2019-08-14 NOTE — Telephone Encounter (Signed)
Patient is requesting RF- already pended for PCP review

## 2019-08-14 NOTE — Telephone Encounter (Signed)
Requested Prescriptions  Pending Prescriptions Disp Refills  . WIXELA INHUB 500-50 MCG/DOSE AEPB [Pharmacy Med Name: WIXELA INHUB DISKUS 500/50MCG 60S] 60 each 2    Sig: INHALE 1 PUFF BY MOUTH TWICE DAILY     Pulmonology:  Combination Products Passed - 08/14/2019  2:00 PM      Passed - Valid encounter within last 12 months    Recent Outpatient Visits          3 months ago Acute midline low back pain with left-sided sciatica   [facility] [place], [place]andra Bevel[place], PA-C   1 year ago Fatigue, unspecified type   [facility] [place], [place], [place]   2 years ago Attention deficit hyperactivity disorder (ADHD), combined type   [facility], [place], [place]   2 years ago Elevated LFTs   [facility] [place], [place], [place]   3 years ago Hyperglycemia   [facility] [place], [place], [place]

## 2019-08-14 NOTE — Telephone Encounter (Signed)
Pt has contacted his  pharm and [place] and would like [place] refill on wixela inhub 500-50. Pt last seen jenni on 04-24-2019. Pt does not have upcoming appt. [facility] [place] [address]

## 2019-10-09 ENCOUNTER — Other Ambulatory Visit: Payer: Self-pay | Admitting: Physician Assistant

## 2019-10-09 ENCOUNTER — Telehealth: Payer: Self-pay

## 2019-10-09 DIAGNOSIS — M62838 Other muscle spasm: Secondary | ICD-10-CM

## 2019-10-09 DIAGNOSIS — M5442 Lumbago with sciatica, left side: Secondary | ICD-10-CM

## 2019-10-09 MED ORDER — BACLOFEN 10 MG PO TABS: 10.0000 mg | ORAL_TABLET | Freq: Three times a day (TID) | ORAL | 0 refills | Status: AC

## 2019-10-09 MED ORDER — BACLOFEN 10 MG PO TABS: 10.0000 mg | ORAL_TABLET | Freq: Three times a day (TID) | ORAL | 0 refills | Status: DC

## 2019-10-09 NOTE — Telephone Encounter (Signed)
Copied from CRM [id number]. Topic: General - Other >> Oct 09, 2019  1:13 PM Mcneil, Ja-Kwan wrote: Reason for CRM: Pt requests new Rx for baclofen (LIORESAL) 10 MG tablet or something a little stronger for back spasms. Pt request that the Rx be sent to: [id number] [place], [place] - [address] AT [facility] [address] [facility]  Phone: [phone]  Fax: [phone]

## 2019-10-09 NOTE — Telephone Encounter (Signed)
Refilled baclofen to [facility] [place]

## 2019-10-09 NOTE — Telephone Encounter (Signed)
Spoke with patient and advised him as below, he states Baclofen in fine he states he hasnt taken that much but since his back is "acting up" he would like refill. Patient states that he normally takes one to drive and if pain is persistent he states at home he will take 2 more tabs. I advised and stressed to patient about side affect of drowsiness while on medication. I advised patient not to operate a motor vehicle while on  Medication, patient understood. KW

## 2019-10-09 NOTE — Telephone Encounter (Signed)
Patient advised that medication was sent in to [facility] in [place].

## 2019-10-09 NOTE — Addendum Note (Signed)
Addended by: Margaretann Loveless on: 10/09/2019 02:34 PM   Modules accepted: Orders

## 2019-10-09 NOTE — Telephone Encounter (Signed)
I could refill baclofen or change therapy.  If we change therapy, he should be cautious as the others cause more drowsiness side effect. He cannot drive with the others.

## 2019-10-09 NOTE — Telephone Encounter (Signed)
Requested medication (s) are due for refill today -no  Requested medication (s) are on the active medication list -no  Future visit scheduled -no  Last refill: 04/24/19  Notes to clinic: Request for non delegated Rx- listed as discontinued and not on current med list  Requested Prescriptions  Pending Prescriptions Disp Refills   baclofen (LIORESAL) 10 MG tablet [Pharmacy Med Name: BACLOFEN 10MG  TABLETS] 30 tablet     Sig: TAKE 1 TABLET(10 MG) BY MOUTH THREE TIMES DAILY      Not Delegated - Analgesics:  Muscle Relaxants Failed - 10/09/2019  1:27 PM      Failed - This refill cannot be delegated      Passed - Valid encounter within last 6 months    Recent Outpatient Visits           5 months ago Acute midline low back pain with left-sided sciatica   [facility] [place], [place], [place]   2 years ago Fatigue, unspecified type   [facility] [place], [place], [place]   2 years ago Attention deficit hyperactivity disorder (ADHD), combined type   [facility], [place], [place]   2 years ago Elevated LFTs   [facility] [place], [place], [place]   3 years ago Hyperglycemia   [facility] [place]                  Requested Prescriptions  Pending Prescriptions Disp Refills   baclofen (LIORESAL) 10 MG tablet [Pharmacy Med Name: BACLOFEN 10MG  TABLETS] 30 tablet     Sig: TAKE 1 TABLET(10 MG) BY MOUTH THREE TIMES DAILY      Not Delegated - Analgesics:  Muscle Relaxants Failed - 10/09/2019  1:27 PM      Failed - This refill cannot be delegated      Passed - Valid encounter within last 6 months    Recent Outpatient Visits           5 months ago Acute midline low back pain with left-sided sciatica   [facility] [place], [facility], [place]   2 years ago Fatigue, unspecified type   [facility] [place], [place], [place]   2 years ago Attention deficit  hyperactivity disorder (ADHD), combined type   [facility], [place], [facility]   2 years ago Elevated LFTs   [facility] [place], [place], [place]   3 years ago Hyperglycemia   [facility] [place], [place], [place]

## 2019-10-09 NOTE — Telephone Encounter (Signed)
Okay to approve Bacolfen? Seems like patient is requesting higher dose. KW

## 2020-01-05 ENCOUNTER — Encounter: Payer: Self-pay | Admitting: Physician Assistant

## 2020-01-15 ENCOUNTER — Encounter: Payer: Self-pay | Admitting: Emergency Medicine

## 2020-01-15 ENCOUNTER — Encounter: Payer: Self-pay | Admitting: Physician Assistant

## 2020-01-15 ENCOUNTER — Other Ambulatory Visit: Payer: Self-pay

## 2020-01-15 ENCOUNTER — Ambulatory Visit
Admission: EM | Admit: 2020-01-15 | Discharge: 2020-01-15 | Disposition: A | Discharge status: Home / Self Care | Payer: BC Managed Care – PPO | Attending: Emergency Medicine | Admitting: Emergency Medicine

## 2020-01-15 DIAGNOSIS — U071 COVID-19: Secondary | ICD-10-CM | POA: Diagnosis not present

## 2020-01-15 DIAGNOSIS — J45901 Unspecified asthma with (acute) exacerbation: Secondary | ICD-10-CM | POA: Diagnosis not present

## 2020-01-15 DIAGNOSIS — R059 Cough, unspecified: Secondary | ICD-10-CM | POA: Insufficient documentation

## 2020-01-15 DIAGNOSIS — Z20822 Contact with and (suspected) exposure to covid-19: Secondary | ICD-10-CM | POA: Diagnosis not present

## 2020-01-15 LAB — RESP PANEL BY RT-PCR (FLU A&B, COVID) ARPGX2
Influenza A by PCR: NEGATIVE
Influenza B by PCR: NEGATIVE
SARS Coronavirus 2 by RT PCR: POSITIVE — AB

## 2020-01-15 MED ORDER — PREDNISONE 20 MG PO TABS: 40.0000 mg | ORAL_TABLET | Freq: Every day | ORAL | 0 refills | Status: AC

## 2020-01-15 MED ORDER — ALBUTEROL SULFATE HFA 108 (90 BASE) MCG/ACT IN AERS
1.0000 | INHALATION_SPRAY | RESPIRATORY_TRACT | 0 refills | Status: DC | PRN
Start: 2020-01-15 — End: 2020-07-19

## 2020-01-15 NOTE — ED Triage Notes (Signed)
Pt c/o covid exposure x 4 days. Pt states last week his son and wife tested positive. Pt states he has had fever, cough with yellow sputum, chills, body aches. Pt states he is out of his asthma medication and does not have an inhaler.

## 2020-01-15 NOTE — Discharge Instructions (Addendum)

## 2020-01-15 NOTE — ED Provider Notes (Signed)
[facility]    CSN: [id number] Arrival date & time: 01/15/20  1104      History   Chief Complaint Chief Complaint  Patient presents with  . Covid Exposure  . Fever  . Chills  . Headache    HPI Vincent Melendez is a 44 y.o. male presenting for 4-day history of fever, cough that is occasionally productive, chills and body aches.  Patient states he has been exposed to Covid through his wife and son who tested positive.  Patient does have a history of asthma and states he is out of his asthma inhaler.  He states he uses albuterol as needed.  Denies any shortness of breath but states he has had some wheezing recently.  Patient has not recorded temperature but has felt feverish.  Denies any headaches, weakness, sore throat, abdominal pain, nausea/vomiting or diarrhea.  Patient not vaccinated for Covid.  He has taken over-the-counter Mucinex but no other medications.  There are no other complaints or concerns at this time.  HPI  Past Medical History:  Diagnosis Date  . Asthma     Patient Active Problem List   Diagnosis Date Noted  . Acute stress disorder 09/23/2014  . Allergic rhinitis 09/23/2014  . Anxiety 09/23/2014  . Airway hyperreactivity 09/23/2014  . Acid reflux 09/23/2014  . Irregular cardiac rhythm 09/23/2014  . RAD (reactive airway disease) 09/23/2014  . Rosacea 09/23/2014  . Eczema 09/23/2014    Past Surgical History:  Procedure Laterality Date  . INNER EAR SURGERY Right   . VASECTOMY         Home Medications    Prior to Admission medications   Medication Sig Start Date End Date Taking? Authorizing Provider  albuterol (VENTOLIN HFA) 108 (90 Base) MCG/ACT inhaler Inhale 1-2 puffs into the lungs every 4 (four) hours as needed for wheezing or shortness of breath. 01/15/20 02/14/20  Eusebio Friendly B, PA-C  baclofen (LIORESAL) 10 MG tablet Take 1 tablet (10 mg total) by mouth 3 (three) times daily. 10/09/19   Margaretann Loveless, PA-C    butalbital-acetaminophen-caffeine (FIORICET) [phone] MG tablet Take 1 tablet by mouth every 6 (six) hours as needed for headache. 06/06/19 06/05/20  Tommie Sams, DO  fexofenadine (ALLEGRA) 180 MG tablet Take 180 mg by mouth daily.    [provider]  Fluticasone-Salmeterol (WIXELA INHUB) 500-50 MCG/DOSE AEPB Inhale 1 puff into the lungs 2 (two) times daily. 08/14/19   Margaretann Loveless, PA-C  montelukast (SINGULAIR) 10 MG tablet TAKE 1 TABLET BY MOUTH AT BEDTIME 07/11/19   Margaretann Loveless, PA-C  predniSONE (DELTASONE) 20 MG tablet Take 2 tablets (40 mg total) by mouth daily for 5 days. 01/15/20 01/20/20  Eusebio Friendly B, PA-C  fluticasone (FLONASE ALLERGY RELIEF) 50 MCG/ACT nasal spray Place into the nose. Reported on 04/04/2015  06/06/19  [provider]  loratadine (CLARITIN REDITABS) 10 MG dissolvable tablet Take by mouth.  06/06/19  [provider]    Family History Family History  Problem Relation Age of Onset  . Fibromyalgia Mother   . COPD Father   . Kidney disease Father   . Healthy Sister   . Healthy Sister   . Arthritis Sister   . Healthy Sister   . Bipolar disorder Sister   . Healthy Sister   . Asthma Brother   . Anxiety disorder Brother   . Healthy Brother   . Healthy Brother   . Healthy Brother     Social History Social  History   Tobacco Use  . Smoking status: Never Smoker  . Smokeless tobacco: Never Used  Vaping Use  . Vaping Use: Never used  Substance Use Topics  . Alcohol use: Yes    Comment: occasionally  . Drug use: No     Allergies   Codeine   Review of Systems Review of Systems  Constitutional: Positive for chills, fatigue and fever.  HENT: Positive for congestion and rhinorrhea. Negative for sinus pressure, sinus pain and sore throat.   Respiratory: Positive for cough and wheezing. Negative for shortness of breath.   Cardiovascular: Negative for chest pain.  Gastrointestinal: Negative for abdominal pain,  diarrhea, nausea and vomiting.  Musculoskeletal: Negative for myalgias.  Neurological: Negative for weakness, light-headedness and headaches.  Hematological: Negative for adenopathy.     Physical Exam Triage Vital Signs ED Triage Vitals  Enc Vitals Group     BP 01/15/20 1143 137/80     Pulse Rate 01/15/20 1143 90     Resp --      Temp 01/15/20 1143 98.2 F (36.8 C)     Temp Source 01/15/20 1143 Oral     SpO2 01/15/20 1143 100 %     Weight --      Height --      Head Circumference --      Peak Flow --      Pain Score 01/15/20 1140 6     Pain Loc --      Pain Edu? --      Excl. in GC? --    No data found.  Updated Vital Signs BP 137/80 (BP Location: Left Arm)   Pulse 90   Temp 98.2 F (36.8 C) (Oral)   SpO2 100%       Physical Exam Vitals and nursing note reviewed.  Constitutional:      General: He is not in acute distress.    Appearance: Normal appearance. He is well-developed. He is not ill-appearing, toxic-appearing or diaphoretic.  HENT:     Head: Normocephalic and atraumatic.     Nose: Congestion and rhinorrhea present.     Mouth/Throat:     Mouth: Mucous membranes are moist.     Pharynx: Oropharynx is clear. Uvula midline. No oropharyngeal exudate.     Tonsils: No tonsillar abscesses.  Eyes:     General: No scleral icterus.       Right eye: No discharge.        Left eye: No discharge.     Conjunctiva/sclera: Conjunctivae normal.  Neck:     Thyroid: No thyromegaly.     Trachea: No tracheal deviation.  Cardiovascular:     Rate and Rhythm: Normal rate and regular rhythm.     Heart sounds: Normal heart sounds.  Pulmonary:     Effort: Pulmonary effort is normal. No respiratory distress.     Breath sounds: Normal breath sounds. No wheezing, rhonchi or rales.  Musculoskeletal:     Cervical back: Normal range of motion and neck supple.  Lymphadenopathy:     Cervical: No cervical adenopathy.  Skin:    General: Skin is warm and dry.     Findings: No  rash.  Neurological:     General: No focal deficit present.     Mental Status: He is alert. Mental status is at baseline.     Motor: No weakness.     Gait: Gait normal.  Psychiatric:        Mood and Affect: Mood normal.  Behavior: Behavior normal.        Thought Content: Thought content normal.      UC Treatments / Results  Labs (all labs ordered are listed, but only abnormal results are displayed) Labs Reviewed  RESP PANEL BY RT-PCR (FLU A&B, COVID) [id number]    EKG   Radiology No results found.  Procedures Procedures (including critical care time)  Medications Ordered in UC Medications - No data to display  Initial Impression / Assessment and Plan / UC Course  I have reviewed the triage vital signs and the nursing notes.  Pertinent labs & imaging results that were available during my care of the patient were reviewed by me and considered in my medical decision making (see chart for details).   44 year old male presenting for cough and congestion following Covid exposure.  He has a history of asthma.  I have refilled his asthma inhaler and given prednisone for mild asthma exacerbation given his complaint of wheezing.  All vital signs are stable and his chest is clear to auscultation on exam.  Respiratory panel obtained.  Advised patient I will call him with results.  If his Covid test positive, we can refer for MAB therapy.  I explained I will call at that time and can his information to the infusion clinic.   At this time, advised him to call and rest increase fluid intake.  Advised to continue Mucinex his breathing treatments.  ED precautions discussed.  Isolate for 6 more days.  Follow-up with our department as needed.  Any increased breathing difficulty or fevers go to emergency department.  Positive Covid test.  Referral placed to infusion clinic for MAB therapy.  Final Clinical Impressions(s) / UC Diagnoses   Final diagnoses:  Viral illness  Cough    Exposure to COVID-19 virus  Asthma with acute exacerbation, unspecified asthma severity, unspecified whether persistent     Discharge Instructions     You have received COVID testing today either for positive exposure, concerning symptoms that could be related to COVID infection, screening purposes, or re-testing after confirmed positive.  Your test obtained today checks for active viral infection in the last 1-2 weeks. If your test is negative now, you can still test positive later. So, if you do develop symptoms you should either get re-tested and/or isolate x 10 days. Please follow CDC guidelines.  While Rapid antigen tests come back in 15-20 minutes, send out PCR/molecular test results typically come back within 24 hours. In the mean time, if you are symptomatic, assume this could be a positive test and treat/monitor yourself as if you do have COVID.   We will call with test results. Please download the MyChart app and set up a profile to access test results.   If symptomatic, go home and rest. Push fluids. Take Tylenol as needed for discomfort. Gargle warm salt water. Throat lozenges. Take Mucinex DM or Robitussin for cough. Humidifier in bedroom to ease coughing. Warm showers. Also review the COVID handout for more information.  COVID-19 INFECTION: The incubation period of COVID-19 is approximately 14 days after exposure, with most symptoms developing in roughly 4-5 days. Symptoms may range in severity from mild to critically severe. Roughly 80% of those infected will have mild symptoms. People of any age may become infected with COVID-19 and have the ability to transmit the virus. The most common symptoms include: fever, fatigue, cough, body aches, headaches, sore throat, nasal congestion, shortness of breath, nausea, vomiting, diarrhea, changes in smell and/or taste.  COURSE OF ILLNESS Some patients may begin with mild disease which can progress quickly into critical symptoms. If your  symptoms are worsening please call ahead to the Emergency Department and proceed there for further treatment. Recovery time appears to be roughly 1-2 weeks for mild symptoms and 3-6 weeks for severe disease.   GO IMMEDIATELY TO ER FOR FEVER YOU ARE UNABLE TO GET DOWN WITH TYLENOL, BREATHING PROBLEMS, CHEST PAIN, FATIGUE, LETHARGY, INABILITY TO EAT OR DRINK, ETC  QUARANTINE AND ISOLATION: To help decrease the spread of COVID-19 please remain isolated if you have COVID infection or are highly suspected to have COVID infection. This means -stay home and isolate to one room in the home if you live with others. Do not share a bed or bathroom with others while ill, sanitize and wipe down all countertops and keep common areas clean and disinfected. You may discontinue isolation if you have a mild case and are asymptomatic 10 days after symptom onset as long as you have been fever free >24 hours without having to take Motrin or Tylenol. If your case is more severe (meaning you develop pneumonia or are admitted in the hospital), you may have to isolate longer.   If you have been in close contact (within 6 feet) of someone diagnosed with COVID 19, you are advised to quarantine in your home for 14 days as symptoms can develop anywhere from 2-14 days after exposure to the virus. If you develop symptoms, you  must isolate.  Most current guidelines for COVID after exposure -isolate 10 days if you ARE NOT tested for COVID as long as symptoms do not develop -isolate 7 days if you are tested and remain asymptomatic -You do not necessarily need to be tested for COVID if you have + exposure and        develop   symptoms. Just isolate at home x10 days from symptom onset During this global pandemic, CDC advises to practice social distancing, try to stay at least 21ft away from others at all times. Wear a face covering. Wash and sanitize your hands regularly and avoid going anywhere that is not necessary.  KEEP IN MIND THAT  THE COVID TEST IS NOT 100% ACCURATE AND YOU SHOULD STILL DO EVERYTHING TO PREVENT POTENTIAL SPREAD OF VIRUS TO OTHERS (WEAR MASK, WEAR GLOVES, WASH HANDS AND SANITIZE REGULARLY). IF INITIAL TEST IS NEGATIVE, THIS MAY NOT MEAN YOU ARE DEFINITELY NEGATIVE. MOST ACCURATE TESTING IS DONE 5-7 DAYS AFTER EXPOSURE.   It is not advised by CDC to get re-tested after receiving a positive COVID test since you can still test positive for weeks to months after you have already cleared the virus.   *If you have not been vaccinated for COVID, I strongly suggest you consider getting vaccinated as long as there are no contraindications.      ED Prescriptions    Medication Sig Dispense Auth. Provider   predniSONE (DELTASONE) 20 MG tablet Take 2 tablets (40 mg total) by mouth daily for 5 days. 10 tablet Eusebio Friendly B, PA-C   albuterol (VENTOLIN HFA) 108 (90 Base) MCG/ACT inhaler Inhale 1-2 puffs into the lungs every 4 (four) hours as needed for wheezing or shortness of breath. 1 g Shirlee Latch, PA-C     PDMP not reviewed this encounter.   Shirlee Latch, PA-C 01/15/20 1303

## 2020-01-16 ENCOUNTER — Encounter: Payer: Self-pay | Admitting: Physician Assistant

## 2020-01-22 ENCOUNTER — Encounter: Payer: Self-pay | Admitting: Physician Assistant

## 2020-04-19 ENCOUNTER — Encounter: Payer: Self-pay | Admitting: Physician Assistant

## 2020-04-19 DIAGNOSIS — F902 Attention-deficit hyperactivity disorder, combined type: Secondary | ICD-10-CM

## 2020-04-19 MED ORDER — LISDEXAMFETAMINE DIMESYLATE 10 MG PO CAPS: 10.0000 mg | ORAL_CAPSULE | Freq: Every day | ORAL | 0 refills | Status: AC

## 2020-05-02 ENCOUNTER — Telehealth: Payer: Self-pay

## 2020-05-02 DIAGNOSIS — J4540 Moderate persistent asthma, uncomplicated: Secondary | ICD-10-CM

## 2020-05-02 NOTE — Telephone Encounter (Signed)
[facility] faxed refill request for the following medications:  Fluticasone-Salmeterol (WIXELA INHUB) 500-50 MCG/DOSE AEPB    Please advise.

## 2020-05-03 MED ORDER — FLUTICASONE-SALMETEROL 500-50 MCG/DOSE IN AEPB: 1.0000 | INHALATION_SPRAY | Freq: Two times a day (BID) | RESPIRATORY_TRACT | 0 refills | Status: DC

## 2020-07-19 ENCOUNTER — Ambulatory Visit: Payer: BC Managed Care – PPO | Admitting: Family Medicine

## 2020-07-19 ENCOUNTER — Other Ambulatory Visit: Payer: Self-pay

## 2020-07-19 ENCOUNTER — Encounter: Payer: Self-pay | Admitting: Family Medicine

## 2020-07-19 VITALS — BP 109/73 | HR 78 | Temp 98.2°F | Resp 20 | Ht 69.5 in | Wt 217.0 lb

## 2020-07-19 DIAGNOSIS — R17 Unspecified jaundice: Secondary | ICD-10-CM | POA: Diagnosis not present

## 2020-07-19 DIAGNOSIS — J4540 Moderate persistent asthma, uncomplicated: Secondary | ICD-10-CM | POA: Diagnosis not present

## 2020-07-19 DIAGNOSIS — K5900 Constipation, unspecified: Secondary | ICD-10-CM

## 2020-07-19 DIAGNOSIS — J301 Allergic rhinitis due to pollen: Secondary | ICD-10-CM

## 2020-07-19 MED ORDER — ALBUTEROL SULFATE HFA 108 (90 BASE) MCG/ACT IN AERS: 2.0000 | INHALATION_SPRAY | Freq: Four times a day (QID) | RESPIRATORY_TRACT | 2 refills | Status: AC | PRN

## 2020-07-19 MED ORDER — MONTELUKAST SODIUM 10 MG PO TABS: 1.0000 | ORAL_TABLET | Freq: Every day | ORAL | 3 refills | Status: DC

## 2020-07-19 NOTE — Progress Notes (Signed)
Established patient visit   Patient: Vincent Melendez   DOB: 12/11/1975   45 y.o. Male  MRN: [id number] Visit Date: 07/19/2020  Today's healthcare provider: Dortha Kern, PA-C   Chief Complaint  Patient presents with  . Asthma  . Constipation  . spot on hand   Subjective    HPI  Skin Lesion Patient presents today c/o yellow spot on the palm of his right hand. He denies tenderness. He is unsure of how long it has been there. Patient has never had this before.   Asthma Patient reports that his breathing has gotten worse in the last few weeks. He does not have his rescue inhaler anymore. He does still take Wixela inhaler daily. Patient also mentions that he is out of Singulair. He has occasional wheezing, but reports that this is mainly after activity.   Constipation  Patient mentions that he has had issues with constipation. He has had symptoms x several months. He describes it as inadequate emptying. Patient has not used anything OTC to help with symptoms.   Past Medical History:  Diagnosis Date  . Asthma    Past Surgical History:  Procedure Laterality Date  . INNER EAR SURGERY Right   . VASECTOMY     Social History   Tobacco Use  . Smoking status: Never Smoker  . Smokeless tobacco: Never Used  Vaping Use  . Vaping Use: Never used  Substance Use Topics  . Alcohol use: Yes    Comment: occasionally  . Drug use: No   Family History  Problem Relation Age of Onset  . Fibromyalgia Mother   . COPD Father   . Kidney disease Father   . Healthy Sister   . Healthy Sister   . Arthritis Sister   . Healthy Sister   . Bipolar disorder Sister   . Healthy Sister   . Asthma Brother   . Anxiety disorder Brother   . Healthy Brother   . Healthy Brother   . Healthy Brother    Allergies  Allergen Reactions  . Codeine Itching       Medications: Outpatient Medications Prior to Visit  Medication Sig  . fexofenadine (ALLEGRA) 180 MG tablet Take 180 mg by mouth  daily.  . Fluticasone-Salmeterol (WIXELA INHUB) 500-50 MCG/DOSE AEPB Inhale 1 puff into the lungs 2 (two) times daily. Please schedule an office visit before anymore refills.  Marland Kitchen lisdexamfetamine (VYVANSE) 10 MG capsule Take 1 capsule (10 mg total) by mouth daily.  . montelukast (SINGULAIR) 10 MG tablet TAKE 1 TABLET BY MOUTH AT BEDTIME  . albuterol (VENTOLIN HFA) 108 (90 Base) MCG/ACT inhaler Inhale 1-2 puffs into the lungs every 4 (four) hours as needed for wheezing or shortness of breath.  . baclofen (LIORESAL) 10 MG tablet Take 1 tablet (10 mg total) by mouth 3 (three) times daily.   No facility-administered medications prior to visit.    Review of Systems  Constitutional: Positive for fatigue.  HENT: Positive for mouth sores.   Respiratory: Positive for chest tightness, shortness of breath and wheezing.        Has asthma  Gastrointestinal: Positive for constipation. Negative for abdominal pain and nausea.  Genitourinary: Negative for dysuria and hematuria.  Skin: Positive for rash.  Neurological: Positive for headaches.       Objective    BP 109/73   Pulse 78   Temp 98.2 F (36.8 C)   Resp 20   Ht 5' 9.5" (1.765 m)  Wt 217 lb (98.4 kg)   SpO2 100%   BMI 31.59 kg/m     Physical Exam Constitutional:      General: He is not in acute distress.    Appearance: He is well-developed.  HENT:     Head: Normocephalic and atraumatic.     Right Ear: Hearing and tympanic membrane normal.     Left Ear: Hearing and tympanic membrane normal.     Nose: Nose normal.     Mouth/Throat:     Pharynx: Oropharynx is clear.  Eyes:     General: Lids are normal. No scleral icterus.       Right eye: No discharge.        Left eye: No discharge.     Conjunctiva/sclera: Conjunctivae normal.     Comments: No jaundice.   Cardiovascular:     Rate and Rhythm: Normal rate and regular rhythm.     Pulses: Normal pulses.     Heart sounds: Normal heart sounds.  Pulmonary:     Effort: Pulmonary  effort is normal. No respiratory distress.     Breath sounds: Normal breath sounds.  Abdominal:     General: Bowel sounds are normal.     Palpations: Abdomen is soft. There is no mass.     Tenderness: There is no abdominal tenderness.  Musculoskeletal:        General: Normal range of motion.     Cervical back: Normal range of motion and neck supple.  Skin:    Findings: No lesion or rash.  Neurological:     Mental Status: He is alert and oriented to person, place, and time.  Psychiatric:        Speech: Speech normal.        Behavior: Behavior normal.        Thought Content: Thought content normal.      No results found for any visits on 07/19/20.  Assessment & Plan    1. Moderate persistent asthma without complication Some wheezing with using only the Advair. Will refill the Singulair and Ventolin. May use antihistamine prn allergic rhinitis. Recheck if no better with getting back on these meds regularly. - CBC with Differential/Platelet - albuterol (VENTOLIN HFA) 108 (90 Base) MCG/ACT inhaler; Inhale 2 puffs into the lungs every 6 (six) hours as needed for wheezing or shortness of breath.  Dispense: 8 g; Refill: 2 - montelukast (SINGULAIR) 10 MG tablet; Take 1 tablet (10 mg total) by mouth at bedtime.  Dispense: 90 tablet; Refill: 3  2. Constipation, unspecified constipation type Dry stools without blood in stools or diarrhea. Increase fluid intake and use stool softener with extra fiber. - CBC with Differential/Platelet - Comprehensive metabolic panel  3. Yellow skin No sign of jaundice today. Coloration seemed to clear with drinking extra fluids. May have been beta carotene in diet. - CBC with Differential/Platelet - Comprehensive metabolic panel   No follow-ups on file.      I, Tamyrah Burbage, PA-C, have reviewed all documentation for this visit. The documentation on 07/19/20 for the exam, diagnosis, procedures, and orders are all accurate and complete.    Dortha Kern, PA-C  [facility] [phone] (phone) [phone] (fax)  [facility]

## 2020-07-19 NOTE — Patient Instructions (Signed)

## 2020-07-22 LAB — COMPREHENSIVE METABOLIC PANEL
ALT: 44 IU/L (ref 0–44)
AST: 25 IU/L (ref 0–40)
Albumin/Globulin Ratio: 2.4 — ABNORMAL HIGH (ref 1.2–2.2)
Albumin: 4.6 g/dL (ref 4.0–5.0)
Alkaline Phosphatase: 67 IU/L (ref 44–121)
BUN/Creatinine Ratio: 16 (ref 9–20)
BUN: 16 mg/dL (ref 6–24)
Bilirubin Total: 0.5 mg/dL (ref 0.0–1.2)
CO2: 23 mmol/L (ref 20–29)
Calcium: 9.3 mg/dL (ref 8.7–10.2)
Chloride: 101 mmol/L (ref 96–106)
Creatinine, Ser: 1.02 mg/dL (ref 0.76–1.27)
Globulin, Total: 1.9 g/dL (ref 1.5–4.5)
Glucose: 115 mg/dL — ABNORMAL HIGH (ref 65–99)
Potassium: 4.6 mmol/L (ref 3.5–5.2)
Sodium: 139 mmol/L (ref 134–144)
Total Protein: 6.5 g/dL (ref 6.0–8.5)
eGFR: 92 mL/min/{1.73_m2} (ref >59)

## 2020-07-22 LAB — CBC WITH DIFFERENTIAL/PLATELET
Basophils Absolute: 0 10*3/uL (ref 0.0–0.2)
Basos: 1 %
EOS (ABSOLUTE): 0.2 10*3/uL (ref 0.0–0.4)
Eos: 4 %
Hematocrit: 45.4 % (ref 37.5–51.0)
Hemoglobin: 15.6 g/dL (ref 13.0–17.7)
Immature Grans (Abs): 0 10*3/uL (ref 0.0–0.1)
Immature Granulocytes: 0 %
Lymphocytes Absolute: 1 10*3/uL (ref 0.7–3.1)
Lymphs: 17 %
MCH: 30.8 pg (ref 26.6–33.0)
MCHC: 34.4 g/dL (ref 31.5–35.7)
MCV: 90 fL (ref 79–97)
Monocytes Absolute: 0.7 10*3/uL (ref 0.1–0.9)
Monocytes: 11 %
Neutrophils Absolute: 4.2 10*3/uL (ref 1.4–7.0)
Neutrophils: 67 %
Platelets: 247 10*3/uL (ref 150–450)
RBC: 5.06 x10E6/uL (ref 4.14–5.80)
RDW: 12.1 % (ref 11.6–15.4)
WBC: 6.2 10*3/uL (ref 3.4–10.8)

## 2020-09-18 ENCOUNTER — Other Ambulatory Visit: Payer: Self-pay | Admitting: Physician Assistant

## 2020-09-18 DIAGNOSIS — J4540 Moderate persistent asthma, uncomplicated: Secondary | ICD-10-CM

## 2020-09-19 NOTE — Telephone Encounter (Signed)
Please review. LOV was [employer] in 06/2020. Has an appt with you on 10/10/20.

## 2020-10-04 ENCOUNTER — Other Ambulatory Visit: Payer: Self-pay

## 2020-10-04 ENCOUNTER — Encounter: Payer: Self-pay | Admitting: Family Medicine

## 2020-10-04 ENCOUNTER — Ambulatory Visit (INDEPENDENT_AMBULATORY_CARE_PROVIDER_SITE_OTHER): Payer: BC Managed Care – PPO | Admitting: Family Medicine

## 2020-10-04 VITALS — BP 103/67 | HR 62 | Temp 98.1°F | Ht 69.0 in | Wt 209.1 lb

## 2020-10-04 DIAGNOSIS — Z6830 Body mass index (BMI) 30.0-30.9, adult: Secondary | ICD-10-CM

## 2020-10-04 DIAGNOSIS — E669 Obesity, unspecified: Secondary | ICD-10-CM | POA: Diagnosis not present

## 2020-10-04 DIAGNOSIS — Z1159 Encounter for screening for other viral diseases: Secondary | ICD-10-CM | POA: Diagnosis not present

## 2020-10-04 DIAGNOSIS — Z1211 Encounter for screening for malignant neoplasm of colon: Secondary | ICD-10-CM

## 2020-10-04 DIAGNOSIS — R7303 Prediabetes: Secondary | ICD-10-CM | POA: Insufficient documentation

## 2020-10-04 DIAGNOSIS — Z0000 Encounter for general adult medical examination without abnormal findings: Secondary | ICD-10-CM

## 2020-10-04 NOTE — Progress Notes (Signed)
Complete physical exam   Patient: Vincent Melendez   DOB: 08-17-1975   45 y.o. Male  MRN: [id number] Visit Date: 10/04/2020  Today's healthcare provider: Shirlee Latch, MD   Chief Complaint  Patient presents with   Complete Physical Exam     Subjective    Vincent Melendez is a 45 y.o. male who presents today for a complete physical exam.  He reports consuming a general diet. The patient has a physically strenuous job, but has no regular exercise apart from work.  He generally feels well. He reports sleeping well. He does not have additional problems to discuss today.   HPI  Prediabetes  Last visit he was told that he was in prediabetic range and wants to know the updated level since he has lost weight.   Wt Readings from Last 3 Encounters:  10/04/20 209 lb 1.6 oz (94.8 kg)  07/19/20 217 lb (98.4 kg)  06/11/19 200 lb (90.7 kg)   Blood Pressure  His blood pressure typically runs low. He denies dizziness or light-headedness when standing. BP Readings from Last 3 Encounters:  10/04/20 103/67  07/19/20 109/73  01/15/20 137/80   Vaccines  He denies receiving the COVID vaccines.   Screenings  Colonoscopy  He had one when he was 45 yr old because he had blood in his stool. He is agreeable to referral for GI.   Past Medical History:  Diagnosis Date   Asthma    Past Surgical History:  Procedure Laterality Date   INNER EAR SURGERY Right    VASECTOMY     Social History   Socioeconomic History   Marital status: Married    Spouse name: Not on file   Number of children: 2   Years of education: Not on file   Highest education level: Not on file  Occupational History   Occupation: Self-employed    Comment: Full Time; [employer]  Tobacco Use   Smoking status: Never   Smokeless tobacco: Never  Vaping Use   Vaping Use: Never used  Substance and Sexual Activity   Alcohol use: Yes    Comment: occasionally   Drug use: No   Sexual activity:  Not on file  Other Topics Concern   Not on file  Social History Narrative   Not on file   Social Determinants of Health   Financial Resource Strain: Not on file  Food Insecurity: Not on file  Transportation Needs: Not on file  Physical Activity: Not on file  Stress: Not on file  Social Connections: Not on file  Intimate Partner Violence: Not on file   Family Status  Relation Name Status   Mother  Alive   Father  Deceased at age 76       Died from pneumonia   Sister  Alive   Sister  Alive   Sister  Alive   Sister  Alive   Brother  Alive   Brother  Alive   Brother  Alive   Family History  Problem Relation Age of Onset   Fibromyalgia Mother    COPD Father    Kidney disease Father    Healthy Sister    Healthy Sister    Arthritis Sister    Healthy Sister    Bipolar disorder Sister    Healthy Sister    Asthma Brother    Anxiety disorder Brother    Healthy Brother    Healthy Brother    Healthy Brother  Allergies  Allergen Reactions   Codeine Itching    Patient Care Team: Erasmo Downer, MD as PCP - General (Family Medicine)   Medications: Outpatient Medications Prior to Visit  Medication Sig   WIXELA INHUB 500-50 MCG/ACT AEPB INHALE 1 PUFF INTO THE LUNGS TWICE DAILY   albuterol (VENTOLIN HFA) 108 (90 Base) MCG/ACT inhaler Inhale 2 puffs into the lungs every 6 (six) hours as needed for wheezing or shortness of breath.   baclofen (LIORESAL) 10 MG tablet Take 1 tablet (10 mg total) by mouth 3 (three) times daily.   fexofenadine (ALLEGRA) 180 MG tablet Take 180 mg by mouth daily.   lisdexamfetamine (VYVANSE) 10 MG capsule Take 1 capsule (10 mg total) by mouth daily.   montelukast (SINGULAIR) 10 MG tablet Take 1 tablet (10 mg total) by mouth at bedtime.   No facility-administered medications prior to visit.   Review of Systems  Constitutional:  Negative for chills, fatigue and fever.  HENT:  Negative for ear pain, sinus pressure, sinus pain and sore  throat.   Eyes:  Negative for pain and visual disturbance.  Respiratory:  Negative for cough, chest tightness, shortness of breath and wheezing.   Cardiovascular:  Negative for chest pain, palpitations and leg swelling.  Gastrointestinal:  Negative for abdominal pain, blood in stool, constipation, diarrhea, nausea and vomiting.  Genitourinary:  Negative for dysuria, flank pain, frequency and urgency.  Musculoskeletal:  Negative for back pain, myalgias and neck pain.  Neurological:  Negative for dizziness, seizures, syncope, weakness, light-headedness, numbness and headaches.  All other systems reviewed and are negative.    Objective     Vitals:   10/04/20 1123  BP: 103/67  Pulse: 62  Temp: 98.1 F (36.7 C)  SpO2: 98%    BP Readings from Last 3 Encounters:  07/19/20 109/73  01/15/20 137/80  06/11/19 119/81    Wt Readings from Last 3 Encounters:  07/19/20 217 lb (98.4 kg)  06/11/19 200 lb (90.7 kg)  06/06/19 200 lb (90.7 kg)      Physical Exam Vitals reviewed.  Constitutional:      General: He is not in acute distress.    Appearance: Normal appearance. He is well-developed. He is not diaphoretic.  HENT:     Head: Normocephalic and atraumatic.     Right Ear: Tympanic membrane, ear canal and external ear normal.     Left Ear: Tympanic membrane, ear canal and external ear normal.     Nose: Nose normal.     Mouth/Throat:     Mouth: Mucous membranes are moist.     Pharynx: Oropharynx is clear. No oropharyngeal exudate.  Eyes:     General: No scleral icterus.    Conjunctiva/sclera: Conjunctivae normal.     Pupils: Pupils are equal, round, and reactive to light.  Neck:     Thyroid: No thyromegaly.  Cardiovascular:     Rate and Rhythm: Normal rate and regular rhythm.     Pulses: Normal pulses.     Heart sounds: Normal heart sounds. No murmur heard. Pulmonary:     Effort: Pulmonary effort is normal. No respiratory distress.     Breath sounds: Normal breath sounds. No  wheezing or rales.  Abdominal:     General: There is no distension.     Palpations: Abdomen is soft.     Tenderness: There is no abdominal tenderness.  Musculoskeletal:        General: No deformity.     Cervical back: Neck supple.  Right lower leg: No edema.     Left lower leg: No edema.  Lymphadenopathy:     Cervical: No cervical adenopathy.  Skin:    General: Skin is warm and dry.     Findings: No rash.  Neurological:     Mental Status: He is alert and oriented to person, place, and time. Mental status is at baseline.     Sensory: No sensory deficit.     Motor: No weakness.     Gait: Gait normal.  Psychiatric:        Mood and Affect: Mood normal.        Behavior: Behavior normal.        Thought Content: Thought content normal.     Last depression screening scores PHQ 2/9 Scores 11/16/2016  PHQ - 2 Score 0   Last fall risk screening No flowsheet data found. Last Audit-C alcohol use screening No flowsheet data found. A score of 3 or more in women, and 4 or more in men indicates increased risk for alcohol abuse, EXCEPT if all of the points are from question 1   No results found for any visits on 10/04/20.  Assessment & Plan     Problem List Items Addressed This Visit       Other   Class 1 obesity without serious comorbidity with body mass index (BMI) of 30.0 to 30.9 in adult    Discussed importance of healthy weight management Discussed diet and exercise       Relevant Orders   Hemoglobin A1c   Lipid panel   Prediabetes    Recommend low carb diet Recheck A1c       Relevant Orders   Hemoglobin A1c   Other Visit Diagnoses     Encounter for annual physical exam    -  Primary   Relevant Orders   Hemoglobin A1c   Lipid panel   Hepatitis C Antibody   Need for hepatitis C screening test       Relevant Orders   Hepatitis C Antibody   Colon cancer screening       Relevant Orders   Ambulatory referral to Gastroenterology        Routine Health  Maintenance and Physical Exam  Exercise Activities and Dietary recommendations  Goals   None     Immunization History  Administered Date(s) Administered   Tdap 11/16/2016    Health Maintenance  Topic Date Due   COVID-19 Vaccine (1) Never done   Hepatitis C Screening  Never done   COLONOSCOPY (Pts 45-3yrs Insurance coverage will need to be confirmed)  Never done   INFLUENZA VACCINE  09/19/2020   TETANUS/TDAP  11/17/2026   HIV Screening  Completed   Pneumococcal Vaccine 64-80 Years old  Aged Out   HPV VACCINES  Aged Out    Discussed health benefits of physical activity, and encouraged him to engage in regular exercise appropriate for his age and condition.   Return in about 6 months (around 04/06/2021) for chronic disease f/u.     I,Essence Turner,acting as a Neurosurgeon for Shirlee Latch, MD.,have documented all relevant documentation on the behalf of Shirlee Latch, MD,as directed by  Shirlee Latch, MD while in the presence of Shirlee Latch, MD.  I, Shirlee Latch, MD, have reviewed all documentation for this visit. The documentation on 10/04/20 for the exam, diagnosis, procedures, and orders are all accurate and complete.   Katsumi Wisler, Marzella Schlein, MD, MPH [facility]

## 2020-10-04 NOTE — Assessment & Plan Note (Signed)
Recommend low carb diet °Recheck A1c  °

## 2020-10-04 NOTE — Assessment & Plan Note (Signed)
Discussed importance of healthy weight management Discussed diet and exercise  

## 2020-11-06 ENCOUNTER — Other Ambulatory Visit: Payer: Self-pay | Admitting: Family Medicine

## 2020-11-06 DIAGNOSIS — J4540 Moderate persistent asthma, uncomplicated: Secondary | ICD-10-CM

## 2020-11-07 ENCOUNTER — Other Ambulatory Visit: Payer: Self-pay | Admitting: Family Medicine

## 2020-11-07 DIAGNOSIS — J4540 Moderate persistent asthma, uncomplicated: Secondary | ICD-10-CM

## 2020-11-07 NOTE — Telephone Encounter (Signed)
LOV 10/04/20 Approved per protocol.

## 2020-12-12 IMAGING — CR DG LUMBAR SPINE COMPLETE 4+V
1 series · 5 of 5 positions shown · non-contrast
Comparison: None.

CLINICAL DATA: Low back pain

EXAM:
LUMBAR SPINE - COMPLETE 4+ VIEW

[Series 1: dg lumbar spine complete 4 +v · 0.14mm/px · 5 of 5 slices shown]
[im 1/5]
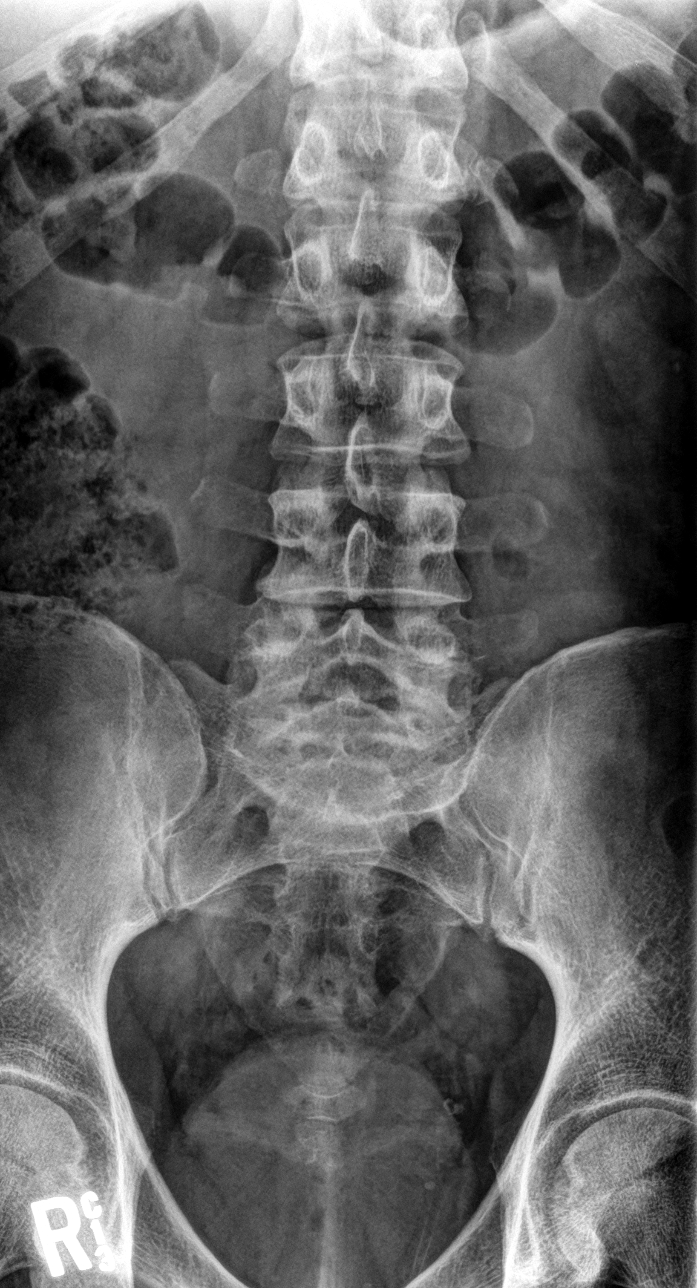
[im 2/5]
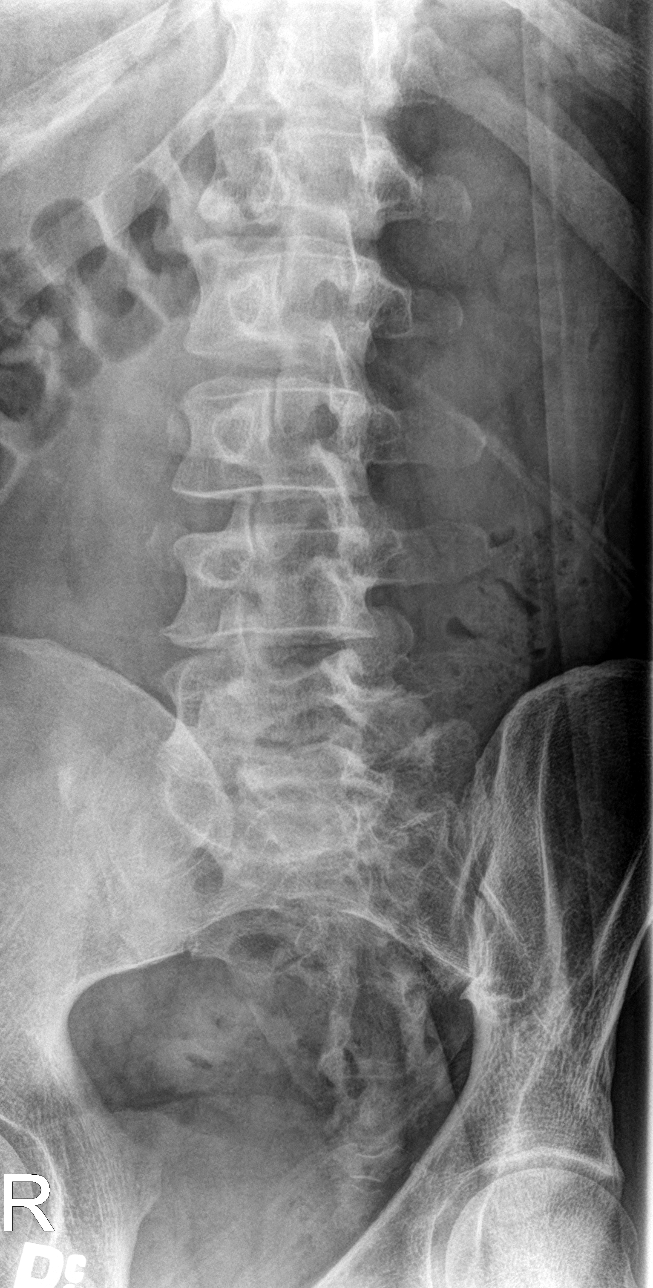
[im 3/5]
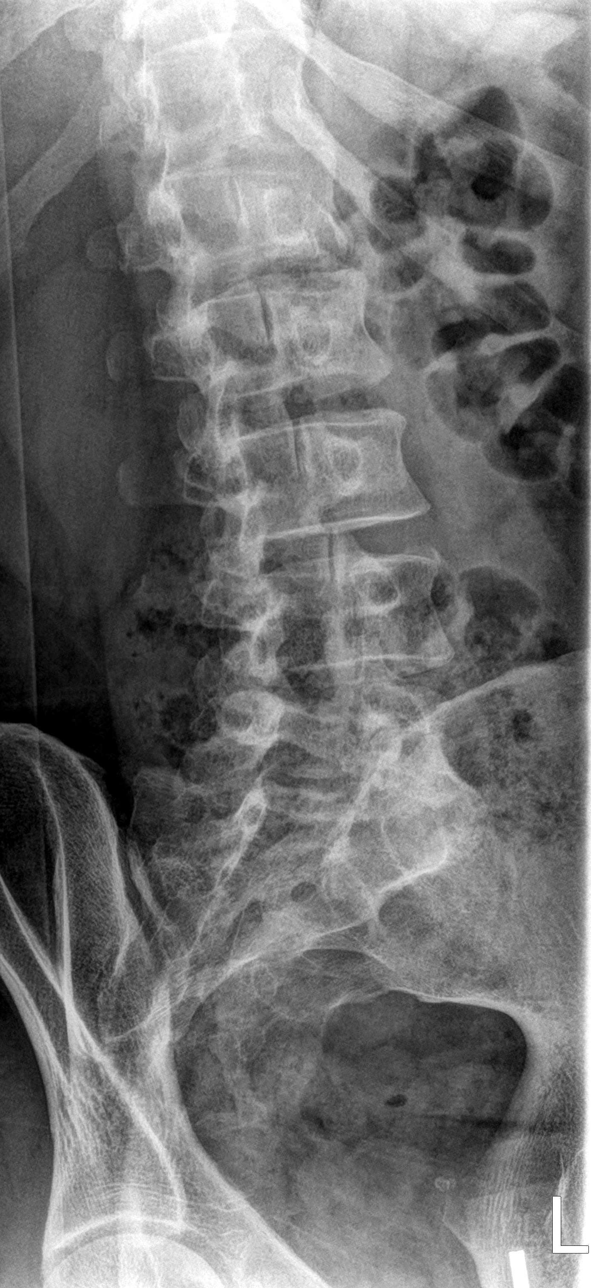
[im 4/5]
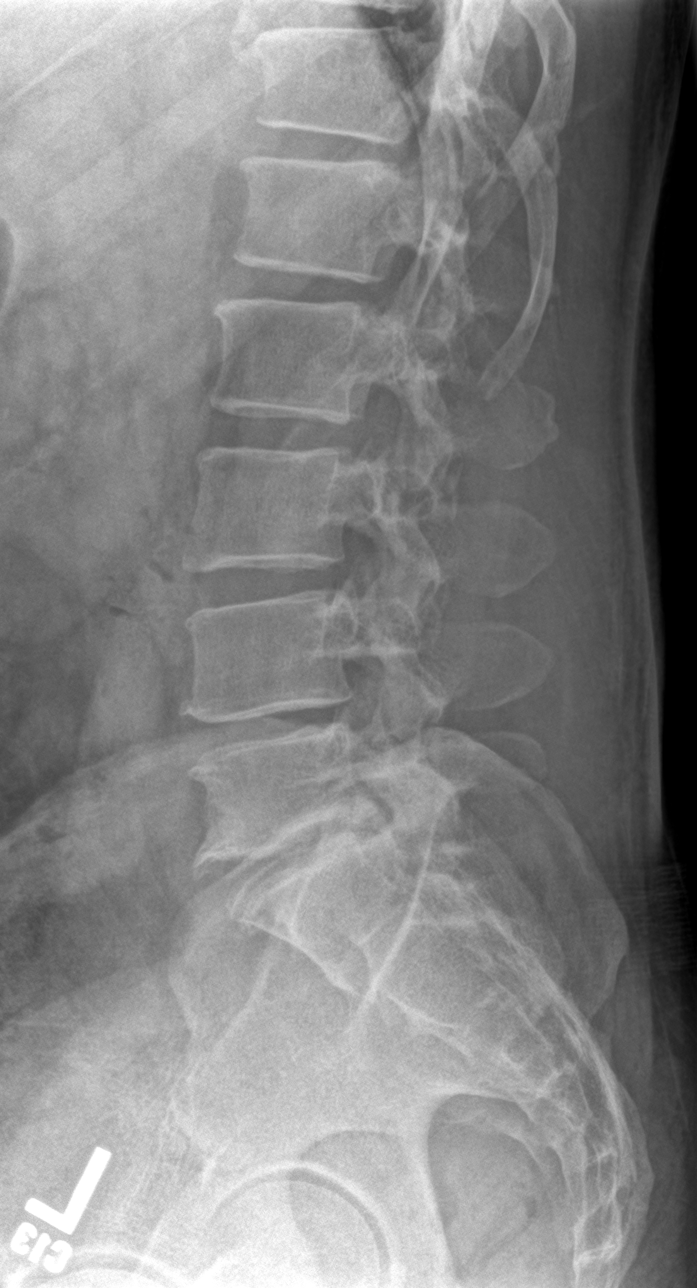
[im 5/5]
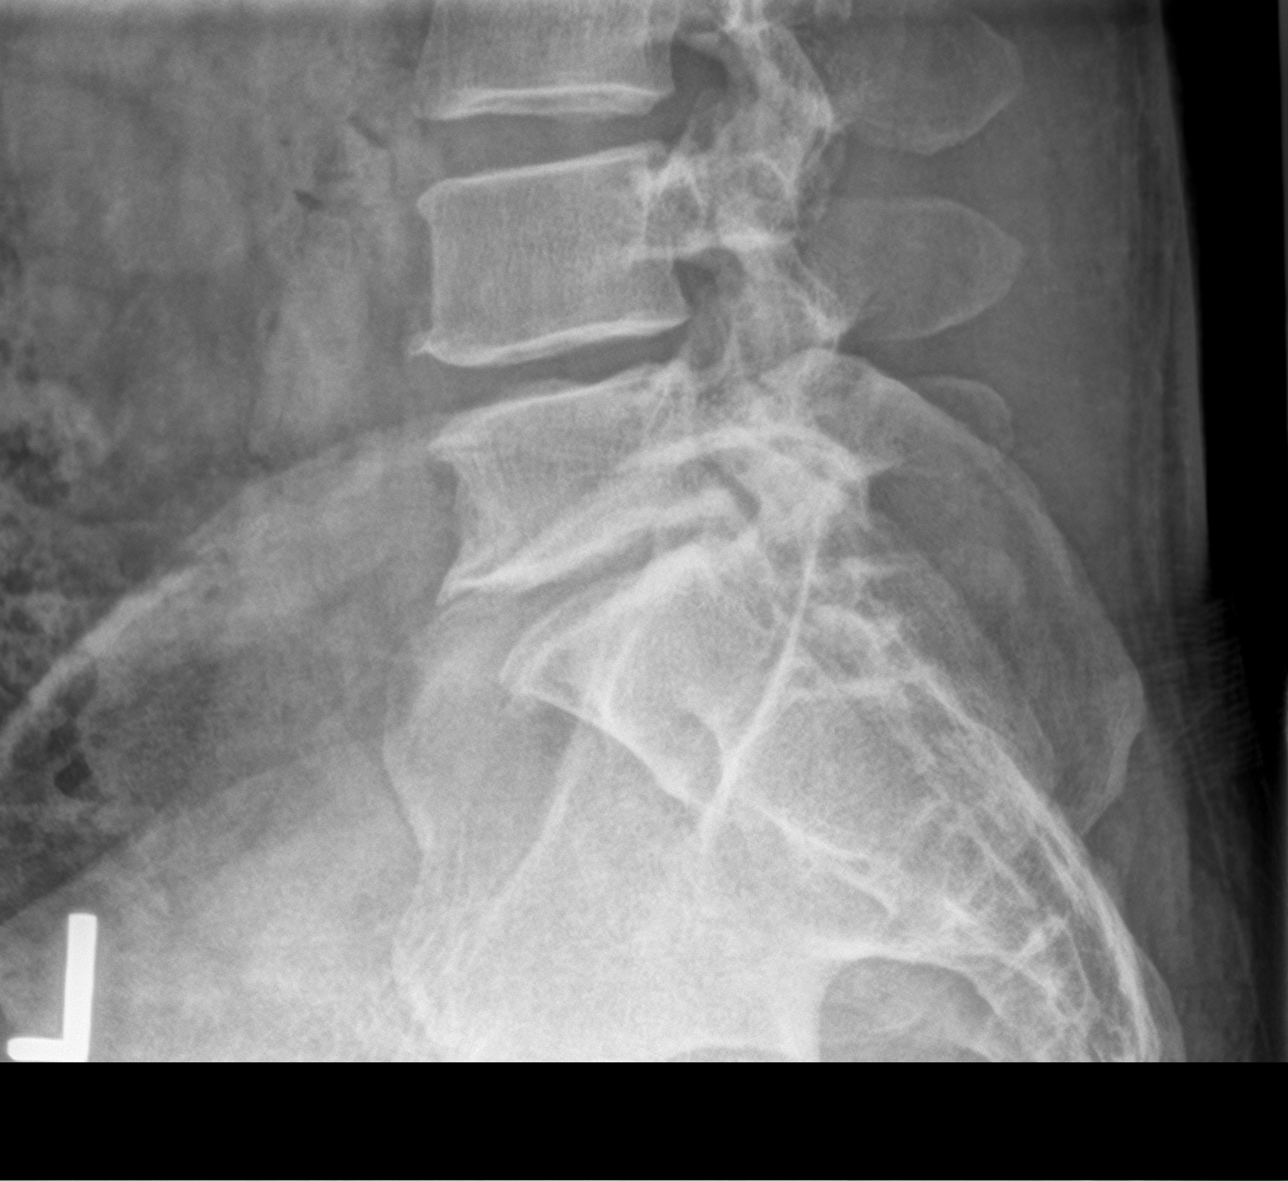

[5 of 5 positions shown; findings below may reference images not displayed]

FINDINGS: There are degenerative changes of the lower lumbar spine with
moderate disc height loss at the L5-S1 level. There is facet
arthrosis at the lower lumbar segments. There is no displaced
fracture. No acute malalignment.
IMPRESSION: Degenerative changes of the lower lumbar spine. No acute fracture or
malalignment.

## 2021-01-28 ENCOUNTER — Other Ambulatory Visit: Payer: Self-pay

## 2021-01-28 ENCOUNTER — Encounter: Payer: Self-pay | Admitting: Emergency Medicine

## 2021-01-28 ENCOUNTER — Ambulatory Visit
Admission: EM | Admit: 2021-01-28 | Discharge: 2021-01-28 | Disposition: A | Discharge status: Home / Self Care | Payer: BC Managed Care – PPO | Attending: Family Medicine | Admitting: Family Medicine

## 2021-01-28 DIAGNOSIS — J4541 Moderate persistent asthma with (acute) exacerbation: Secondary | ICD-10-CM

## 2021-01-28 DIAGNOSIS — J4540 Moderate persistent asthma, uncomplicated: Secondary | ICD-10-CM

## 2021-01-28 DIAGNOSIS — J45901 Unspecified asthma with (acute) exacerbation: Secondary | ICD-10-CM

## 2021-01-28 DIAGNOSIS — B349 Viral infection, unspecified: Secondary | ICD-10-CM

## 2021-01-28 LAB — POCT INFLUENZA A/B
Influenza A, POC: NEGATIVE
Influenza B, POC: NEGATIVE

## 2021-01-28 MED ORDER — PREDNISONE 20 MG PO TABS: 40.0000 mg | ORAL_TABLET | Freq: Every day | ORAL | 0 refills | Status: AC

## 2021-01-28 MED ORDER — PROMETHAZINE-DM 6.25-15 MG/5ML PO SYRP
5.0000 mL | ORAL_SOLUTION | Freq: Four times a day (QID) | ORAL | 0 refills | Status: AC | PRN
Start: 2021-01-28 — End: ?

## 2021-01-28 MED ORDER — FLUTICASONE-SALMETEROL 500-50 MCG/ACT IN AEPB: INHALATION_SPRAY | RESPIRATORY_TRACT | 0 refills | Status: DC

## 2021-01-28 NOTE — ED Provider Notes (Signed)
[facility]  Provider Note   CSN: [id number] Arrival date & time: 01/28/21  1032     History Chief Complaint  Patient presents with   Headache   Nasal Congestion   Generalized Body Aches   Cough   Fever    Vincent Melendez is a 45 y.o. male.  HPI  Patient presents today with flulike symptoms of headache, nasal congestion, generalized body aches, cough and subjective fever.  Symptoms developed x1 day ago.He has used his albuterol inhaler as he experienced wheezing last night. Patient has a history of reactive airway disease.  Denies any wheezing, shortness of breath, or chest pain.     Past Medical History:  Diagnosis Date   Asthma     Patient Active Problem List   Diagnosis Date Noted   Class 1 obesity without serious comorbidity with body mass index (BMI) of 30.0 to 30.9 in adult 10/04/2020   Prediabetes 10/04/2020   Acute stress disorder 09/23/2014   Allergic rhinitis 09/23/2014   Anxiety 09/23/2014   Airway hyperreactivity 09/23/2014   Acid reflux 09/23/2014   Irregular cardiac rhythm 09/23/2014   RAD (reactive airway disease) 09/23/2014   Rosacea 09/23/2014   Eczema 09/23/2014    Past Surgical History:  Procedure Laterality Date   INNER EAR SURGERY Right    VASECTOMY         Family History  Problem Relation Age of Onset   Fibromyalgia Mother    COPD Father    Kidney disease Father    Healthy Sister    Healthy Sister    Arthritis Sister    Healthy Sister    Bipolar disorder Sister    Healthy Sister    Asthma Brother    Anxiety disorder Brother    Healthy Brother    Healthy Brother    Healthy Brother     Social History   Tobacco Use   Smoking status: Never   Smokeless tobacco: Never  Vaping Use   Vaping Use: Never used  Substance Use Topics   Alcohol use: Yes    Comment: occasionally   Drug use: No    Home Medications Prior to Admission medications   Medication Sig Start Date End Date Taking?  Authorizing Provider  fexofenadine (ALLEGRA) 180 MG tablet Take 180 mg by mouth daily.   Yes [provider]  montelukast (SINGULAIR) 10 MG tablet Take 1 tablet (10 mg total) by mouth at bedtime. 07/19/20  Yes Chrismon, Jodell Cipro, PA-C  predniSONE (DELTASONE) 20 MG tablet Take 2 tablets (40 mg total) by mouth daily with breakfast. 01/28/21  Yes Bing Neighbors, FNP  promethazine-dextromethorphan (PROMETHAZINE-DM) 6.25-15 MG/5ML syrup Take 5 mLs by mouth 4 (four) times daily as needed for cough. 01/28/21  Yes Bing Neighbors, FNP  albuterol (VENTOLIN HFA) 108 (90 Base) MCG/ACT inhaler Inhale 2 puffs into the lungs every 6 (six) hours as needed for wheezing or shortness of breath. 07/19/20   Chrismon, Jodell Cipro, PA-C  baclofen (LIORESAL) 10 MG tablet Take 1 tablet (10 mg total) by mouth 3 (three) times daily. Patient not taking: Reported on 10/04/2020 10/09/19   Margaretann Loveless, PA-C  fluticasone-salmeterol [facility] INHUB) 500-50 MCG/ACT AEPB INHALE 1 PUFF INTO THE LUNGS TWICE DAILY 01/28/21   Bing Neighbors, FNP  Ketoconazole 2 % FOAM Apply 50 g topically at bedtime. 04/11/20   [provider]  lisdexamfetamine (VYVANSE) 10 MG capsule Take 1 capsule (10 mg total) by mouth daily. 04/19/20  Joycelyn Man M, PA-C  fluticasone [facility] ALLERGY RELIEF) 50 MCG/ACT nasal spray Place into the nose. Reported on 04/04/2015  06/06/19  [provider]  loratadine (CLARITIN REDITABS) 10 MG dissolvable tablet Take by mouth.  06/06/19  [provider]    Allergies    Codeine  Review of Systems   Review of Systems Pertinent negatives listed in HPI  Physical Exam Updated Vital Signs BP 126/79 (BP Location: Left Arm)   Pulse 82   Temp 98.5 F (36.9 C) (Oral)   Resp 20   SpO2 97%   Physical Exam  General Appearance:    Alert, acutely ill appearing, cooperative, no distress  HENT:   Normocephalic, ears normal, nares with congestion, rhinorrhea, oropharynx   patent with neck nodes   Eyes:    PERRL, conjunctiva/corneas clear, EOM's intact       Lungs:     Clear to auscultation bilaterally, respirations unlabored  Heart:    Regular rate and rhythm  Neurologic:   Awake, alert, oriented x 3. No apparent focal neurological           defect.      ED Results / Procedures / Treatments   Labs (all labs ordered are listed, but only abnormal results are displayed) Labs Reviewed  COVID-19, FLU A+B NAA  POCT INFLUENZA A/B    EKG None  Radiology No results found.  Procedures Procedures   Medications Ordered in ED Medications - No data to display  ED Course  I have reviewed the triage vital signs and the nursing notes.  Pertinent labs & imaging results that were available during my care of the patient were reviewed by me and considered in my medical decision making (see chart for details).    Viral illness, rapid flu negative. COVID/Flu test pending. Symptom management warranted only.  Manage fever with Tylenol and ibuprofen.  Nasal symptoms with over-the-counter antihistamines recommended.  Treatment per discharge medications/discharge instructions.  Red flags/ER precautions given. The most current CDC isolation/quarantine recommendation advised.   Final Clinical Impression(s) / ED Diagnoses Final diagnoses:  Viral illness    Rx / DC Orders ED Discharge Orders          Ordered    predniSONE (DELTASONE) 20 MG tablet  Daily with breakfast        01/28/21 1117    fluticasone-salmeterol (WIXELA INHUB) 500-50 MCG/ACT AEPB        01/28/21 1117    promethazine-dextromethorphan (PROMETHAZINE-DM) 6.25-15 MG/5ML syrup  4 times daily PRN        01/28/21 1117             Bing Neighbors, [place] 01/28/21 1122

## 2021-01-28 NOTE — ED Triage Notes (Signed)
Pt c/o cough, fever, runny nose, bodyaches and HA sxs started yesterday.

## 2021-01-28 NOTE — Discharge Instructions (Signed)
Your COVID /FLU results should result up to 3days. Negative results are immediately resulted to Mychart. Positive results will receive a follow-up call from our clinic. If symptoms are present, I recommend home quarantine until results are known.  Alternate Tylenol and ibuprofen as needed for body aches and fever.  Symptom management per recommendations discussed today.  If any breathing difficulty or chest pain develops go immediately to the closest emergency department for evaluation.

## 2021-01-30 LAB — COVID-19, FLU A+B NAA
Influenza A, NAA: NOT DETECTED
Influenza B, NAA: NOT DETECTED
SARS-CoV-2, NAA: DETECTED — AB

## 2021-05-04 ENCOUNTER — Other Ambulatory Visit: Payer: Self-pay | Admitting: Family Medicine

## 2021-05-11 ENCOUNTER — Other Ambulatory Visit: Payer: Self-pay | Admitting: Family Medicine

## 2021-05-11 DIAGNOSIS — J4540 Moderate persistent asthma, uncomplicated: Secondary | ICD-10-CM

## 2021-07-18 ENCOUNTER — Other Ambulatory Visit: Payer: Self-pay | Admitting: Family Medicine

## 2021-07-18 ENCOUNTER — Encounter: Payer: Self-pay | Admitting: Family Medicine

## 2021-07-18 DIAGNOSIS — J4540 Moderate persistent asthma, uncomplicated: Secondary | ICD-10-CM

## 2021-08-21 ENCOUNTER — Telehealth: Payer: Self-pay | Admitting: Family Medicine

## 2021-08-21 DIAGNOSIS — J4540 Moderate persistent asthma, uncomplicated: Secondary | ICD-10-CM

## 2021-08-21 MED ORDER — MONTELUKAST SODIUM 10 MG PO TABS: 10.0000 mg | ORAL_TABLET | Freq: Every day | ORAL | 0 refills | Status: AC

## 2021-08-21 NOTE — Telephone Encounter (Signed)
[facility] faxed refill request for the following medications:   montelukast (SINGULAIR) 10 MG tablet   Please advise.  

## 2021-08-25 ENCOUNTER — Other Ambulatory Visit: Payer: Self-pay | Admitting: Family Medicine

## 2021-08-25 DIAGNOSIS — J4540 Moderate persistent asthma, uncomplicated: Secondary | ICD-10-CM

## 2021-08-25 NOTE — Telephone Encounter (Signed)
Carlean Purl, RN to [facility]       08/25/21  3:18 PM Attempted to secure appt. States he has changed providers, "Different PCP."

## 2023-07-17 ENCOUNTER — Ambulatory Visit: Admission: EM | Admit: 2023-07-17 | Discharge: 2023-07-17 | Disposition: A | Discharge status: Home / Self Care

## 2023-07-17 ENCOUNTER — Encounter: Payer: Self-pay | Admitting: Emergency Medicine

## 2023-07-17 DIAGNOSIS — L03012 Cellulitis of left finger: Secondary | ICD-10-CM | POA: Diagnosis not present

## 2023-07-17 MED ORDER — SULFAMETHOXAZOLE-TRIMETHOPRIM 800-160 MG PO TABS: 1.0000 | ORAL_TABLET | Freq: Two times a day (BID) | ORAL | 0 refills | Status: AC

## 2023-07-17 NOTE — ED Provider Notes (Signed)
 [facility]  Note:  This document was prepared using Conservation officer, historic buildings and may include unintentional dictation errors.  MRN: [id number] DOB: 09-04-1975  Subjective:   Vincent Melendez is a 48 y.o. male presenting for finger swelling and erythema following a cut he sustained to his finger approximately 10 days ago while working.  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  Patient reports that bleeding was easily controlled and was healing normally but noticed swelling and redness to the entire length of his finger over the last 4 to 5 days.  Patient is concerned for secondary infection.  Denies any severe pain or purulent discharge from the wound.  Wound is scabbed over and no concern for continued bleeding.  No current facility-administered medications for this encounter.  Current Outpatient Medications:    sulfamethoxazole-trimethoprim (BACTRIM DS) 800-160 MG tablet, Take 1 tablet by mouth 2 (two) times daily for 7 days., Disp: 14 tablet, Rfl: 0   albuterol  (VENTOLIN  HFA) 108 (90 Base) MCG/ACT inhaler, Inhale 2 puffs into the lungs every 6 (six) hours as needed for wheezing or shortness of breath., Disp: 8 g, Rfl: 2   baclofen  (LIORESAL ) 10 MG tablet, Take 1 tablet (10 mg total) by mouth 3 (three) times daily. (Patient not taking: Reported on 10/04/2020), Disp: 30 each, Rfl: 0   fexofenadine (ALLEGRA) 180 MG tablet, Take 180 mg by mouth daily., Disp: , Rfl:    fluticasone -salmeterol (ADVAIR DISKUS) 500-50 MCG/ACT AEPB, Please schedule office visit before any future refill., Disp: 14 each, Rfl: 0   Ketoconazole 2 % FOAM, Apply 50 g topically at bedtime., Disp: , Rfl:    lisdexamfetamine (VYVANSE ) 10 MG capsule, Take 1 capsule (10 mg total) by mouth daily., Disp: 30 capsule, Rfl: 0   montelukast  (SINGULAIR ) 10 MG tablet, Take 1 tablet (10 mg total) by mouth at bedtime. Please schedule office visit  before any future refill., Disp: 90 tablet, Rfl: 0   predniSONE  (DELTASONE ) 20 MG tablet, Take 2 tablets (40 mg total) by mouth daily with breakfast., Disp: 10 tablet, Rfl: 0   promethazine -dextromethorphan (PROMETHAZINE -DM) 6.25-15 MG/5ML syrup, Take 5 mLs by mouth 4 (four) times daily as needed for cough., Disp: 140 mL, Rfl: 0   Allergies  Allergen Reactions   Codeine Itching    Past Medical History:  Diagnosis Date   Asthma      Past Surgical History:  Procedure Laterality Date   INNER EAR SURGERY Right    VASECTOMY      Family History  Problem Relation Age of Onset   Fibromyalgia Mother    COPD Father    Kidney disease Father    Healthy Sister    Healthy Sister    Arthritis Sister    Healthy Sister    Bipolar disorder Sister    Healthy Sister    Asthma Brother    Anxiety disorder Brother    Healthy Brother    Healthy Brother    Healthy Brother     Social History   Tobacco Use   Smoking status: Never   Smokeless tobacco: Never  Vaping Use   Vaping status: Some Days  Substance Use Topics   Alcohol use: Yes    Comment: occasionally   Drug use: No    ROS Refer to HPI for ROS details.  Objective:   Vitals: BP 112/68 (BP Location: Right Arm)   Pulse 63   Temp 98.2 F (  36.8 C) (Oral)   Resp 16   SpO2 98%   Physical Exam Vitals and nursing note reviewed.  Constitutional:      General: He is not in acute distress.    Appearance: Normal appearance. He is well-developed. He is not ill-appearing or toxic-appearing.  HENT:     Head: Normocephalic.  Cardiovascular:     Rate and Rhythm: Normal rate.  Pulmonary:     Effort: Pulmonary effort is normal. No respiratory distress.  Skin:    General: Skin is warm and dry.     Findings: Erythema and wound (Scabbed over wound noted to distal phalanx of the left second finger, moderate swelling to entire finger, mild erythema, no purulent discharge from wound, possible secondary cellulitis.) present.   Neurological:     General: No focal deficit present.     Mental Status: He is alert and oriented to person, place, and time.  Psychiatric:        Mood and Affect: Mood normal.        Behavior: Behavior normal.     Procedures  No results found for this or any previous visit (from the past 24 hours).  Assessment and Plan :     Discharge Instructions       1. Cellulitis of finger of left hand (Primary) - sulfamethoxazole-trimethoprim (BACTRIM DS) 800-160 MG tablet; Take 1 tablet by mouth 2 (two) times daily for 7 days.  Dispense: 14 tablet; Refill: 0 - Continue to monitor for any secondary signs of worsening infection such as increased redness, increased warmth, purulent discharge, increased pain, or fever if you experience any escalation of symptoms follow-up for further evaluation management.    Vincent Melendez Melendez Vincent Melendez   Vincent Melendez, Vincent Melendez, [place] 07/17/23 1135

## 2023-07-17 NOTE — ED Triage Notes (Signed)
 Pt cut his left pointer finger on 5/17 and this morning he noticed some swelling and has pain.

## 2023-07-17 NOTE — Discharge Instructions (Addendum)
  1. Cellulitis of finger of left hand (Primary) - sulfamethoxazole-trimethoprim (BACTRIM DS) 800-160 MG tablet; Take 1 tablet by mouth 2 (two) times daily for 7 days.  Dispense: 14 tablet; Refill: 0 - Continue to monitor for any secondary signs of worsening infection such as increased redness, increased warmth, purulent discharge, increased pain, or fever if you experience any escalation of symptoms follow-up for further evaluation management.
